# Patient Record
Sex: Female | Born: 1990 | Race: White | Hispanic: No | Marital: Married | Smoking: Never smoker
Health system: Southern US, Community
[De-identification: ages and names within clinical notes are randomized; demographics above are authoritative.]

## PROBLEM LIST (undated history)

## (undated) ENCOUNTER — Inpatient Hospital Stay (HOSPITAL_COMMUNITY): Payer: Self-pay

## (undated) DIAGNOSIS — Z9889 Other specified postprocedural states: Secondary | ICD-10-CM

## (undated) DIAGNOSIS — Z8619 Personal history of other infectious and parasitic diseases: Secondary | ICD-10-CM

## (undated) DIAGNOSIS — K8020 Calculus of gallbladder without cholecystitis without obstruction: Secondary | ICD-10-CM

## (undated) DIAGNOSIS — R112 Nausea with vomiting, unspecified: Secondary | ICD-10-CM

## (undated) DIAGNOSIS — F419 Anxiety disorder, unspecified: Secondary | ICD-10-CM

## (undated) DIAGNOSIS — F32A Depression, unspecified: Secondary | ICD-10-CM

## (undated) DIAGNOSIS — Z789 Other specified health status: Secondary | ICD-10-CM

## (undated) DIAGNOSIS — E785 Hyperlipidemia, unspecified: Secondary | ICD-10-CM

## (undated) HISTORY — DX: Other specified postprocedural states: Z98.890

## (undated) HISTORY — PX: ANTERIOR CRUCIATE LIGAMENT REPAIR: SHX115

## (undated) HISTORY — DX: Anxiety disorder, unspecified: F41.9

## (undated) HISTORY — PX: WISDOM TOOTH EXTRACTION: SHX21

## (undated) HISTORY — DX: Calculus of gallbladder without cholecystitis without obstruction: K80.20

## (undated) HISTORY — DX: Other specified postprocedural states: R11.2

## (undated) HISTORY — DX: Hyperlipidemia, unspecified: E78.5

## (undated) HISTORY — DX: Depression, unspecified: F32.A

## (undated) HISTORY — DX: Personal history of other infectious and parasitic diseases: Z86.19

---
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
## 2009-07-28 ENCOUNTER — Emergency Department (HOSPITAL_COMMUNITY): Admission: EM | Admit: 2009-07-28 | Discharge: 2009-07-28 | Payer: Self-pay | Admitting: Emergency Medicine

## 2010-01-19 ENCOUNTER — Emergency Department (HOSPITAL_COMMUNITY): Admission: EM | Admit: 2010-01-19 | Discharge: 2010-01-19 | Payer: Self-pay | Admitting: Emergency Medicine

## 2011-01-08 LAB — POCT PREGNANCY, URINE: Preg Test, Ur: NEGATIVE

## 2011-06-27 ENCOUNTER — Emergency Department (INDEPENDENT_AMBULATORY_CARE_PROVIDER_SITE_OTHER): Payer: BC Managed Care – PPO

## 2011-06-27 ENCOUNTER — Emergency Department (HOSPITAL_BASED_OUTPATIENT_CLINIC_OR_DEPARTMENT_OTHER)
Admission: EM | Admit: 2011-06-27 | Discharge: 2011-06-27 | Disposition: A | Discharge status: Home / Self Care | Payer: BC Managed Care – PPO | Attending: Emergency Medicine | Admitting: Emergency Medicine

## 2011-06-27 ENCOUNTER — Encounter: Payer: Self-pay | Admitting: Emergency Medicine

## 2011-06-27 DIAGNOSIS — M25569 Pain in unspecified knee: Secondary | ICD-10-CM

## 2011-06-27 DIAGNOSIS — S8391XA Sprain of unspecified site of right knee, initial encounter: Secondary | ICD-10-CM

## 2011-06-27 DIAGNOSIS — IMO0002 Reserved for concepts with insufficient information to code with codable children: Secondary | ICD-10-CM | POA: Insufficient documentation

## 2011-06-27 DIAGNOSIS — X500XXA Overexertion from strenuous movement or load, initial encounter: Secondary | ICD-10-CM | POA: Insufficient documentation

## 2011-06-27 MED ORDER — OXYCODONE-ACETAMINOPHEN 5-325 MG PO TABS: 1.0000 | ORAL_TABLET | ORAL | Status: AC | PRN

## 2011-06-27 NOTE — ED Notes (Addendum)
Pt presents to ED today with injury to Right knee that she states "I was climbing out of the pool and knelt down on just the right one"  Pt has very minimal swelling noted and has limited movement without pain to knee.  Pt sent to xray

## 2011-06-27 NOTE — ED Provider Notes (Addendum)
History     CSN: [id number] Arrival date & time: 06/27/2011  8:27 PM  Chief Complaint  Patient presents with  . Knee Injury   Patient is a 20 y.o. female presenting with knee pain. The history is provided by the patient.  Knee Pain This is a new problem. The current episode started 3 to 5 hours ago. The problem occurs constantly. The problem has not changed since onset.Exacerbated by: Pain is worse with movement and with weight bearing. The symptoms are relieved by nothing. She has tried nothing for the symptoms.  She twisted her knee getting out of a swimming pool, and says it was a valgus injury. She has seen Dr. Thomasena Edis for an injury to her other knee. She rates her pain at 8/10.  History reviewed. No pertinent past medical history.  Past Surgical History  Procedure Date  . Anterior cruciate ligament repair     left knee    No family history on file.  History  Substance Use Topics  . Smoking status: Never Smoker   . Smokeless tobacco: Not on file  . Alcohol Use: No    OB History    Grav Para Term Preterm Abortions TAB SAB Ect Mult Living                  Review of Systems  All other systems reviewed and are negative.    Physical Exam  BP 118/74  Pulse 99  Temp 98.1 F (36.7 C)  Resp 18  SpO2 100%  LMP 06/20/2011  Physical Exam  Nursing note and vitals reviewed. Constitutional: She is oriented to person, place, and time. She appears well-developed and well-nourished. No distress.  HENT:  Head: Normocephalic and atraumatic.  Right Ear: External ear normal.  Left Ear: External ear normal.  Mouth/Throat: Oropharynx is clear and moist.  Eyes: Conjunctivae and EOM are normal. Pupils are equal, round, and reactive to light. No scleral icterus.  Neck: Normal range of motion. Neck supple. No JVD present.  Cardiovascular: Normal rate, regular rhythm and normal heart sounds.   No murmur heard. Pulmonary/Chest: Effort normal and breath sounds normal. She has no  wheezes. She has no rales. She exhibits no tenderness.  Abdominal: Soft. Bowel sounds are normal. She exhibits no mass. There is no tenderness.  Musculoskeletal: She exhibits no edema.       Mild swelling of the right knee, with diffuse tenderness. Small suprapatellar effusion present. No instability. Negative Lachman and McMurray Tests.  Lymphadenopathy:    She has no cervical adenopathy.  Neurological: She is alert and oriented to person, place, and time. No cranial nerve deficit. Coordination normal.  Skin: Skin is warm and dry. No rash noted.  Psychiatric: She has a normal mood and affect. Her behavior is normal. Judgment and thought content normal.    ED Course  Procedures She was placed ina knee immobilizer and given crutches. MDM Knee injury - probable meniscus injury, but possible ligamentous injury. X-rays reviewed, images viewed by me.  Results for orders placed during the hospital encounter of 01/19/10  POCT PREGNANCY, URINE      Component Value Range   Preg Test, Ur       Value: NEGATIVE            THE SENSITIVITY OF THIS     METHODOLOGY IS >24 mIU/mL   Dg Knee 2 Views Right  06/27/2011  *RADIOLOGY REPORT*  Clinical Data: Right knee pain after climbing a ladder.  RIGHT KNEE -  1-2 VIEW  Comparison: None.  Findings: AP and lateral views of the right knee demonstrate normal appearing bones and soft tissues.  No effusion is seen.  IMPRESSION: Normal examination.  Original Report Authenticated By: Darrol Angel, M.D.      Dione Booze, MD 06/27/11 2352  Dione Booze, MD 07/09/11 [phone]

## 2011-06-27 NOTE — ED Notes (Signed)
Pt c/o right knee pain after getting out of pool without using ladder.

## 2012-04-06 LAB — OB RESULTS CONSOLE RUBELLA ANTIBODY, IGM: Rubella: IMMUNE

## 2012-04-06 LAB — OB RESULTS CONSOLE GC/CHLAMYDIA: Gonorrhea: NEGATIVE

## 2012-04-06 LAB — OB RESULTS CONSOLE ABO/RH: RH Type: POSITIVE

## 2012-07-26 ENCOUNTER — Inpatient Hospital Stay (HOSPITAL_COMMUNITY)
Admission: AD | Admit: 2012-07-26 | Discharge: 2012-07-26 | Disposition: A | Discharge status: Hospice | Payer: BC Managed Care – PPO | Source: Ambulatory Visit | Attending: Obstetrics & Gynecology | Admitting: Obstetrics & Gynecology

## 2012-07-26 DIAGNOSIS — O99891 Other specified diseases and conditions complicating pregnancy: Secondary | ICD-10-CM | POA: Insufficient documentation

## 2012-07-26 DIAGNOSIS — M549 Dorsalgia, unspecified: Secondary | ICD-10-CM | POA: Insufficient documentation

## 2012-07-26 LAB — URINALYSIS, ROUTINE W REFLEX MICROSCOPIC
Bilirubin Urine: NEGATIVE
Leukocytes, UA: NEGATIVE
Nitrite: NEGATIVE
Urobilinogen, UA: 0.2 mg/dL (ref 0.0–1.0)

## 2012-07-26 NOTE — ED Provider Notes (Signed)
Patient at 28 weeks noted sudden onset of severe back pain about 2 hours ago.  Uncomplicated pregnancy.  Denies vaginal bleeding.  She was concerned that she was having back labor.  Pain is much less severe at this time.  Abdomen is soft and baby is active. Cervix is firm and closed.  Impression:  No evidence for labor or abruption.  Plan:  Monitor baby to establish that no regular contractions are present and that FHT's are reactive.  Routine follow up in office.

## 2012-09-23 LAB — OB RESULTS CONSOLE GBS: GBS: NEGATIVE

## 2012-09-28 ENCOUNTER — Encounter (HOSPITAL_COMMUNITY): Payer: Self-pay | Admitting: *Deleted

## 2012-09-28 ENCOUNTER — Inpatient Hospital Stay (HOSPITAL_COMMUNITY)
Admission: AD | Admit: 2012-09-28 | Discharge: 2012-09-28 | Disposition: A | Discharge status: Home / Self Care | Payer: No Typology Code available for payment source | Source: Ambulatory Visit | Attending: Obstetrics and Gynecology | Admitting: Obstetrics and Gynecology

## 2012-09-28 DIAGNOSIS — O479 False labor, unspecified: Secondary | ICD-10-CM

## 2012-09-28 DIAGNOSIS — L293 Anogenital pruritus, unspecified: Secondary | ICD-10-CM

## 2012-09-28 DIAGNOSIS — L292 Pruritus vulvae: Secondary | ICD-10-CM

## 2012-09-28 HISTORY — DX: Other specified health status: Z78.9

## 2012-09-28 LAB — WET PREP, GENITAL: Clue Cells Wet Prep HPF POC: NONE SEEN

## 2012-09-28 MED ORDER — NYSTATIN-TRIAMCINOLONE 100000-0.1 UNIT/GM-% EX OINT: TOPICAL_OINTMENT | Freq: Two times a day (BID) | CUTANEOUS | Status: DC

## 2012-09-28 NOTE — MAU Note (Signed)
Pt C/O lower back pain that is shooting down her legs, started today.  Also pain in ribs bilaterally.  Pt denies bleeding or LOF.

## 2012-09-28 NOTE — MAU Provider Note (Signed)
Chief Complaint:  Labor Eval  First Provider Initiated Contact with Patient 09/28/12 1932     HPI: Sandra Saunders is a 21 y.o. G1P0 at [redacted]w[redacted]d who presents to maternity admissions reporting intermittent pain shooting down both legs and pressure in her ribs. Was initially unsure if she was contracting, but realized that her discomforts correlated w/ contractions of toco  Also reports vulvar itching and increased yellow discharge. Denies leakage of fluid or vaginal bleeding. Good fetal movement.   Past Medical History: Past Medical History  Diagnosis Date  . No pertinent past medical history     Past obstetric history: OB History    Grav Para Term Preterm Abortions TAB SAB Ect Mult Living   1              # Outc Date GA Lbr Len/2nd Wgt Sex Del Anes PTL Lv   1 CUR               Past Surgical History: Past Surgical History  Procedure Date  . Anterior cruciate ligament repair     left knee  . Wisdom tooth extraction     Family History: History reviewed. No pertinent family history.  Social History: History  Substance Use Topics  . Smoking status: Never Smoker   . Smokeless tobacco: Not on file  . Alcohol Use: No    Allergies:  Allergies  Allergen Reactions  . Cephalexin Rash    Meds:  Prescriptions prior to admission  Medication Sig Dispense Refill  . acetaminophen (TYLENOL) 325 MG tablet Take 650 mg by mouth every 6 (six) hours as needed. For pain      . calcium carbonate (TUMS - DOSED IN MG ELEMENTAL CALCIUM) 500 MG chewable tablet Chew 1-2 tablets by mouth 3 (three) times daily as needed. For heartburn      . Prenatal Vit-Fe Fumarate-FA (PRENATAL MULTIVITAMIN) TABS Take 1 tablet by mouth at bedtime.        ROS: Pertinent findings in history of present illness.  Physical Exam  Blood pressure 114/69, pulse 94, temperature 96.9 F (36.1 C), temperature source Oral, resp. rate 18, height 5\' 5"  (1.651 m), weight 99.156 kg (218 lb 9.6 oz), last menstrual period  06/20/2011. GENERAL: Well-developed, well-nourished female in no acute distress.  HEENT: normocephalic HEART: normal rate RESP: normal effort ABDOMEN: Soft, non-tender, gravid appropriate for gestational age EXTREMITIES: Nontender, no edema NEURO: alert and oriented SPECULUM EXAM: NEFG, physiologic discharge, no blood, cervix clean Dilation: 1 Effacement (%): 30 Station: -3 Presentation: Vertex Exam by:: Ivonne Andrew CNM  FHT:  Baseline 140 , moderate variability, accelerations present, no decelerations Contractions: Irreg, mild   Labs: Results for orders placed during the hospital encounter of 09/28/12 (from the past 24 hour(s))  WET PREP, GENITAL     Status: Abnormal   Collection Time   09/28/12  7:41 PM      Component Value Range   Yeast Wet Prep HPF POC NONE SEEN  NONE SEEN   Trich, Wet Prep NONE SEEN  NONE SEEN   Clue Cells Wet Prep HPF POC NONE SEEN  NONE SEEN   WBC, Wet Prep HPF POC FEW (*) NONE SEEN   Imaging:  No results found.  MAU Course:   Assessment: 1. Vulvovaginal pruritus   2. Braxton Hick's contraction     Plan: Discharge home Labor precautions and fetal kick counts     Follow-up Information    Follow up with HORVATH,MICHELLE A, MD. (as scheduled)  Contact information:   [address] [place] [address] [phone]       Follow up with [facility] [place] [facility]. (As needed if symptoms worsen)    Contact information:   [address] [id number] mc [place] [address] [phone]          Medication List     As of 09/28/2012  8:12 PM    TAKE these medications         acetaminophen 325 MG tablet   Commonly known as: TYLENOL   Take 650 mg by mouth every 6 (six) hours as needed. For pain      calcium carbonate 500 MG chewable tablet   Commonly known as: TUMS - dosed in mg elemental calcium   Chew 1-2 tablets by mouth 3 (three) times daily as needed. For heartburn       nystatin-triamcinolone ointment   Commonly known as: MYCOLOG   Apply topically 2 (two) times daily.      prenatal multivitamin Tabs   Take 1 tablet by mouth at bedtime.         [place], CNM 09/28/2012 8:12 PM

## 2012-09-28 NOTE — MAU Note (Signed)
Patient states she is having back pain that runs down right leg. Not sure if contractions. Denies any leaking or bleeding and reports good fetal movement.

## 2012-10-20 NOTE — L&D Delivery Note (Signed)
Delivery Note At 11:41 PM a viable and healthy female was delivered via Vaginal, Spontaneous Delivery (Presentation: Left Occiput; Anterior  ).  APGAR: 7, 8; weight 7 lb 9.2 oz (3435 g).   Placenta status: Intact, Spontaneous.  Cord: 3 vessels   Pt progressed quickly from 7 to complete and + 2 station.  She pushed for 5 minutes and then delivered the infants head to crowning following the body.  A large gush of fluid followed delivery of the infant from the amnioinfusion.  The infant cried and was passed to the waiting maternal abdomen.  After the cord was clamped and cut the infant was passed to the isolet for additional stimulation and suction.  The infant responded well.  The placenta delivered spontaneously, intact, with 3 vessel cord.  A small partial 3rd degree laceration was repaired with 0 vicryl then a second degree laceration was repaired with 3-0 vicryl.  EBL 300 cc. Mother and baby are doing well after delivery.  Anesthesia: Epidural  Episiotomy: None Lacerations: 3rd degree, partial Suture Repair: 3.0 vicryl, and 0 vicryl Est. Blood Loss (mL): 300  Mom to postpartum.  Baby to nursery-stable.  Nubia Ziesmer,Zandria H. 10/26/2012, 12:25 AM

## 2012-10-21 ENCOUNTER — Telehealth (HOSPITAL_COMMUNITY): Payer: Self-pay | Admitting: *Deleted

## 2012-10-21 ENCOUNTER — Encounter (HOSPITAL_COMMUNITY): Payer: Self-pay | Admitting: *Deleted

## 2012-10-21 NOTE — Telephone Encounter (Signed)
Preadmission screen  

## 2012-10-24 ENCOUNTER — Encounter (HOSPITAL_COMMUNITY): Payer: Self-pay

## 2012-10-24 ENCOUNTER — Inpatient Hospital Stay (HOSPITAL_COMMUNITY)
Admission: RE | Admit: 2012-10-24 | Discharge: 2012-10-27 | DRG: 775 | Disposition: A | Discharge status: Home / Self Care | Payer: No Typology Code available for payment source | Source: Ambulatory Visit | Attending: Obstetrics and Gynecology | Admitting: Obstetrics and Gynecology

## 2012-10-24 VITALS — BP 112/55 | HR 75 | Temp 98.0°F | Resp 18 | Ht 66.0 in | Wt 218.0 lb

## 2012-10-24 DIAGNOSIS — O480 Post-term pregnancy: Principal | ICD-10-CM | POA: Diagnosis present

## 2012-10-24 DIAGNOSIS — Z3480 Encounter for supervision of other normal pregnancy, unspecified trimester: Secondary | ICD-10-CM

## 2012-10-24 LAB — CBC
Platelets: 171 10*3/uL (ref 150–400)
RBC: 4.05 MIL/uL (ref 3.87–5.11)
RDW: 14.8 % (ref 11.5–15.5)
WBC: 10.6 10*3/uL — ABNORMAL HIGH (ref 4.0–10.5)

## 2012-10-24 LAB — TYPE AND SCREEN: Antibody Screen: NEGATIVE

## 2012-10-24 MED ORDER — OXYTOCIN 40 UNITS IN LACTATED RINGERS INFUSION - SIMPLE MED: 62.5000 mL/h | INTRAVENOUS | Status: DC

## 2012-10-24 MED ORDER — LACTATED RINGERS IV SOLN: 500.0000 mL | INTRAVENOUS | Status: DC | PRN

## 2012-10-24 MED ORDER — ZOLPIDEM TARTRATE 5 MG PO TABS
5.0000 mg | ORAL_TABLET | Freq: Every evening | ORAL | Status: DC | PRN
  Administered 2012-10-25: 5 mg via ORAL
  Filled 2012-10-24: qty 1

## 2012-10-24 MED ORDER — OXYCODONE-ACETAMINOPHEN 5-325 MG PO TABS: 1.0000 | ORAL_TABLET | ORAL | Status: DC | PRN

## 2012-10-24 MED ORDER — ACETAMINOPHEN 325 MG PO TABS: 650.0000 mg | ORAL_TABLET | ORAL | Status: DC | PRN

## 2012-10-24 MED ORDER — CITRIC ACID-SODIUM CITRATE 334-500 MG/5ML PO SOLN: 30.0000 mL | ORAL | Status: DC | PRN

## 2012-10-24 MED ORDER — IBUPROFEN 600 MG PO TABS
600.0000 mg | ORAL_TABLET | Freq: Four times a day (QID) | ORAL | Status: DC | PRN
  Administered 2012-10-26: 600 mg via ORAL
  Filled 2012-10-24 (×2): qty 1

## 2012-10-24 MED ORDER — MISOPROSTOL 25 MCG QUARTER TABLET
25.0000 ug | ORAL_TABLET | ORAL | Status: DC | PRN
  Administered 2012-10-24 – 2012-10-25 (×2): 25 ug via VAGINAL
  Filled 2012-10-24 (×2): qty 0.25

## 2012-10-24 MED ORDER — LIDOCAINE HCL (PF) 1 % IJ SOLN
30.0000 mL | INTRAMUSCULAR | Status: DC | PRN
  Administered 2012-10-25: 30 mL via SUBCUTANEOUS
  Filled 2012-10-24: qty 30

## 2012-10-24 MED ORDER — ONDANSETRON HCL 4 MG/2ML IJ SOLN: 4.0000 mg | Freq: Four times a day (QID) | INTRAMUSCULAR | Status: DC | PRN

## 2012-10-24 MED ORDER — TERBUTALINE SULFATE 1 MG/ML IJ SOLN: 0.2500 mg | Freq: Once | INTRAMUSCULAR | Status: AC | PRN

## 2012-10-24 MED ORDER — OXYTOCIN BOLUS FROM INFUSION: 500.0000 mL | INTRAVENOUS | Status: DC

## 2012-10-24 MED ORDER — LACTATED RINGERS IV SOLN
INTRAVENOUS | Status: DC
  Administered 2012-10-25: 07:00:00 via INTRAVENOUS

## 2012-10-24 NOTE — Progress Notes (Signed)
Called Dr. Henderson Cloud and notified of pt admitted, FHR non reactive on admission but not reactive and reassuring. Notified of SVE and medical history. Orders received for Cytotec through the night.

## 2012-10-25 ENCOUNTER — Encounter (HOSPITAL_COMMUNITY): Payer: Self-pay

## 2012-10-25 ENCOUNTER — Encounter (HOSPITAL_COMMUNITY): Payer: Self-pay | Admitting: Anesthesiology

## 2012-10-25 ENCOUNTER — Inpatient Hospital Stay (HOSPITAL_COMMUNITY): Payer: No Typology Code available for payment source | Admitting: Anesthesiology

## 2012-10-25 MED ORDER — FENTANYL 2.5 MCG/ML BUPIVACAINE 1/10 % EPIDURAL INFUSION (WH - ANES)
14.0000 mL/h | INTRAMUSCULAR | Status: DC
  Administered 2012-10-25 (×2): 14 mL/h via EPIDURAL
  Filled 2012-10-25 (×3): qty 125

## 2012-10-25 MED ORDER — PHENYLEPHRINE 40 MCG/ML (10ML) SYRINGE FOR IV PUSH (FOR BLOOD PRESSURE SUPPORT)
80.0000 ug | PREFILLED_SYRINGE | INTRAVENOUS | Status: DC | PRN
  Filled 2012-10-25: qty 5

## 2012-10-25 MED ORDER — LACTATED RINGERS IV SOLN: 500.0000 mL | Freq: Once | INTRAVENOUS | Status: DC

## 2012-10-25 MED ORDER — BUTORPHANOL TARTRATE 1 MG/ML IJ SOLN
1.0000 mg | INTRAMUSCULAR | Status: DC | PRN
  Administered 2012-10-25: 1 mg via INTRAVENOUS
  Filled 2012-10-25: qty 1

## 2012-10-25 MED ORDER — LACTATED RINGERS IV SOLN
INTRAVENOUS | Status: DC
  Administered 2012-10-25 (×2): via INTRAUTERINE

## 2012-10-25 MED ORDER — LIDOCAINE HCL (PF) 1 % IJ SOLN
INTRAMUSCULAR | Status: DC | PRN
  Administered 2012-10-25 (×2): 9 mL

## 2012-10-25 MED ORDER — TERBUTALINE SULFATE 1 MG/ML IJ SOLN: 0.2500 mg | Freq: Once | INTRAMUSCULAR | Status: AC | PRN

## 2012-10-25 MED ORDER — DIPHENHYDRAMINE HCL 50 MG/ML IJ SOLN
12.5000 mg | INTRAMUSCULAR | Status: DC | PRN
  Administered 2012-10-25: 12.5 mg via INTRAVENOUS
  Filled 2012-10-25: qty 1

## 2012-10-25 MED ORDER — EPHEDRINE 5 MG/ML INJ: 10.0000 mg | INTRAVENOUS | Status: DC | PRN

## 2012-10-25 MED ORDER — PHENYLEPHRINE 40 MCG/ML (10ML) SYRINGE FOR IV PUSH (FOR BLOOD PRESSURE SUPPORT): 80.0000 ug | PREFILLED_SYRINGE | INTRAVENOUS | Status: DC | PRN

## 2012-10-25 MED ORDER — EPHEDRINE 5 MG/ML INJ
10.0000 mg | INTRAVENOUS | Status: DC | PRN
  Filled 2012-10-25: qty 4

## 2012-10-25 MED ORDER — OXYTOCIN 40 UNITS IN LACTATED RINGERS INFUSION - SIMPLE MED
1.0000 m[IU]/min | INTRAVENOUS | Status: DC
  Administered 2012-10-25: 2 m[IU]/min via INTRAVENOUS
  Filled 2012-10-25: qty 1000

## 2012-10-25 MED ORDER — FENTANYL 2.5 MCG/ML BUPIVACAINE 1/10 % EPIDURAL INFUSION (WH - ANES)
INTRAMUSCULAR | Status: DC | PRN
  Administered 2012-10-25: 14 mL/h via EPIDURAL

## 2012-10-25 NOTE — Progress Notes (Signed)
Patient ID: Sandra Saunders, female   DOB: 1991/01/15, 22 y.o.   MRN: [id number]    S: Comfortable O: AFVSS FHT 140s occassional variables but no longer severe toc Q3-6  A/P 1) Severe variables resolved.  Restart pit in 30 min

## 2012-10-25 NOTE — Progress Notes (Signed)
Patient ID: ALEXSYS ESKIN, female   DOB: Mar 06, 1991, 22 y.o.   MRN: [id number]  S: Still Comfortable O: AFVSS Cxv 5/90/-1 per RN exam FHT 140 reactive with accels Toco Q 4  A/P 1) Pit at 2, increased to 4 2) FWB reassuring

## 2012-10-25 NOTE — Progress Notes (Signed)
Pt called out feeling like her water broke. No visible fluid noted, pad dry. Fern Negative. Will continue to monitor.

## 2012-10-25 NOTE — Progress Notes (Signed)
Patient ID: Sandra Saunders, female   DOB: [birth year], 22 y.o.   MRN: [id number]  S: Comfortable with epidural O: Filed Vitals:   10/25/12 0831  BP: 106/63  Pulse: 106  Temp:   Resp: 18   cvx tight 3/80/-2 toco Q 2-4 FHT 130-140 reactive  A/P 1) continue pitocin 2) FWB reassuring

## 2012-10-25 NOTE — H&P (Signed)
22 y.o. [redacted]w[redacted]d  G1P0 comes in for postdates induction.  Otherwise has good fetal movement and no bleeding.  Past Medical History  Diagnosis Date  . No pertinent past medical history   . H/O varicella   . PONV (postoperative nausea and vomiting)     Past Surgical History  Procedure Date  . Anterior cruciate ligament repair     left knee  . Wisdom tooth extraction     OB History    Grav Para Term Preterm Abortions TAB SAB Ect Mult Living   1              # Outc Date GA Lbr Len/2nd Wgt Sex Del Anes PTL Lv   1 CUR               History   Social History  . Marital Status: Married    Spouse Name: N/A    Number of Children: N/A  . Years of Education: N/A   Occupational History  . Not on file.   Social History Main Topics  . Smoking status: Never Smoker   . Smokeless tobacco: Not on file  . Alcohol Use: No  . Drug Use: No  . Sexually Active: Yes   Other Topics Concern  . Not on file   Social History Narrative  . No narrative on file   Cephalexin    Prenatal Transfer Tool  Maternal Diabetes: No Genetic Screening: Normal Maternal Ultrasounds/Referrals: Normal Fetal Ultrasounds or other Referrals:  None Maternal Substance Abuse:  No Significant Maternal Medications:  None Significant Maternal Lab Results: None  Other PNC: Uncomplicated.   Filed Vitals:   10/25/12 0400  BP:   Pulse:   Temp:   Resp: 18     Lungs/Cor:  NAD Abdomen:  soft, gravid Ex:  no cords, erythema SVE:  1/50/-2; SROM at 0400. FHTs:  120, good STV, NST R Toco:  q 3-5   A/P   Postdates induction.  S/p two doses cytotec and now on pitocin.  GBS neg.  Sandra Saunders A

## 2012-10-25 NOTE — Anesthesia Procedure Notes (Signed)
Epidural Patient location during procedure: OB Start time: 10/25/2012 6:01 AM End time: 10/25/2012 6:05 AM  Staffing Anesthesiologist: Sandrea Hughs Performed by: anesthesiologist   Preanesthetic Checklist Completed: patient identified, site marked, surgical consent, pre-op evaluation, timeout performed, IV checked, risks and benefits discussed and monitors and equipment checked  Epidural Patient position: sitting Prep: site prepped and draped and DuraPrep Patient monitoring: continuous pulse ox and blood pressure Approach: midline Injection technique: LOR air  Needle:  Needle type: Tuohy  Needle gauge: 17 G Needle length: 9 cm and 9 Needle insertion depth: 6 cm Catheter type: closed end flexible Catheter size: 19 Gauge Catheter at skin depth: 11 cm Test dose: negative and Other  Assessment Sensory level: T8 Events: blood not aspirated, injection not painful, no injection resistance, negative IV test and no paresthesia  Additional Notes Reason for block:procedure for pain

## 2012-10-25 NOTE — Progress Notes (Signed)
Patient ID: Sandra Saunders, female   DOB: 24-Apr-1991, 22 y.o.   MRN: [id number]  S: Comfortable with epidural.  Having variable decelerations O: Filed Vitals:   10/25/12 1001 10/25/12 1012 10/25/12 1031 10/25/12 1103  BP: 104/64  102/73 108/65  Pulse: 95  92 89  Temp:      TempSrc:      Resp:  18    Height:      Weight:      SpO2:       Aox3, NAF Gravid, soft Cvx 4/90/-2 toco Q2-4 FHT140s with accels and variable decelerations. Positive scalp stim  A/P IUPC placed FWB reassuring overall

## 2012-10-25 NOTE — Anesthesia Preprocedure Evaluation (Signed)

## 2012-10-25 NOTE — Progress Notes (Signed)
Patient ID: Sandra Saunders, female   DOB: Jan 24, 1991, 22 y.o.   MRN: [id number]  S: Comfortable O:AFVSS cvx 4-5/80/-2 toco Q2-4 FHT 140 with severe variables with slow return to baseline.  Moderate variability, and accels  A/P 1) pitocin off 2) Continue Amnioinfusion.  D/W pt severe variables with slow return to baseline and remote from delivery.  Will continue amnioinfusion and see how fetus responds.  If severe variables continue given remote from delivery may need to proceed with cesarean section.  This was discussed with patient and her husband.

## 2012-10-26 ENCOUNTER — Encounter (HOSPITAL_COMMUNITY): Payer: Self-pay

## 2012-10-26 LAB — CBC
Hemoglobin: 10.3 g/dL — ABNORMAL LOW (ref 12.0–15.0)
MCV: 87.4 fL (ref 78.0–100.0)
Platelets: 152 10*3/uL (ref 150–400)
RBC: 3.64 MIL/uL — ABNORMAL LOW (ref 3.87–5.11)
WBC: 19.4 10*3/uL — ABNORMAL HIGH (ref 4.0–10.5)

## 2012-10-26 MED ORDER — BENZOCAINE-MENTHOL 20-0.5 % EX AERO
1.0000 "application " | INHALATION_SPRAY | CUTANEOUS | Status: DC | PRN
  Administered 2012-10-26: 1 via TOPICAL
  Filled 2012-10-26: qty 56

## 2012-10-26 MED ORDER — SIMETHICONE 80 MG PO CHEW: 80.0000 mg | CHEWABLE_TABLET | ORAL | Status: DC | PRN

## 2012-10-26 MED ORDER — TETANUS-DIPHTH-ACELL PERTUSSIS 5-2.5-18.5 LF-MCG/0.5 IM SUSP: 0.5000 mL | Freq: Once | INTRAMUSCULAR | Status: DC

## 2012-10-26 MED ORDER — DIPHENHYDRAMINE HCL 25 MG PO CAPS: 25.0000 mg | ORAL_CAPSULE | Freq: Four times a day (QID) | ORAL | Status: DC | PRN

## 2012-10-26 MED ORDER — LANOLIN HYDROUS EX OINT: TOPICAL_OINTMENT | CUTANEOUS | Status: DC | PRN

## 2012-10-26 MED ORDER — ONDANSETRON HCL 4 MG PO TABS: 4.0000 mg | ORAL_TABLET | ORAL | Status: DC | PRN

## 2012-10-26 MED ORDER — OXYCODONE-ACETAMINOPHEN 5-325 MG PO TABS: 1.0000 | ORAL_TABLET | ORAL | Status: DC | PRN

## 2012-10-26 MED ORDER — METHYLERGONOVINE MALEATE 0.2 MG/ML IJ SOLN: 0.2000 mg | INTRAMUSCULAR | Status: DC | PRN

## 2012-10-26 MED ORDER — ONDANSETRON HCL 4 MG/2ML IJ SOLN: 4.0000 mg | INTRAMUSCULAR | Status: DC | PRN

## 2012-10-26 MED ORDER — DIBUCAINE 1 % RE OINT: 1.0000 "application " | TOPICAL_OINTMENT | RECTAL | Status: DC | PRN

## 2012-10-26 MED ORDER — IBUPROFEN 600 MG PO TABS
600.0000 mg | ORAL_TABLET | Freq: Four times a day (QID) | ORAL | Status: DC
  Administered 2012-10-26 – 2012-10-27 (×6): 600 mg via ORAL
  Filled 2012-10-26 (×6): qty 1

## 2012-10-26 MED ORDER — PRENATAL MULTIVITAMIN CH
1.0000 | ORAL_TABLET | Freq: Every day | ORAL | Status: DC
  Administered 2012-10-26 – 2012-10-27 (×2): 1 via ORAL
  Filled 2012-10-26 (×2): qty 1

## 2012-10-26 MED ORDER — ZOLPIDEM TARTRATE 5 MG PO TABS: 5.0000 mg | ORAL_TABLET | Freq: Every evening | ORAL | Status: DC | PRN

## 2012-10-26 MED ORDER — WITCH HAZEL-GLYCERIN EX PADS: 1.0000 "application " | MEDICATED_PAD | CUTANEOUS | Status: DC | PRN

## 2012-10-26 MED ORDER — METHYLERGONOVINE MALEATE 0.2 MG PO TABS: 0.2000 mg | ORAL_TABLET | ORAL | Status: DC | PRN

## 2012-10-26 MED ORDER — SENNOSIDES-DOCUSATE SODIUM 8.6-50 MG PO TABS: 2.0000 | ORAL_TABLET | Freq: Every day | ORAL | Status: DC

## 2012-10-26 NOTE — Anesthesia Postprocedure Evaluation (Signed)
  Anesthesia Post-op Note  Patient: Sandra Saunders  Procedure(s) Performed: * No procedures listed *  Patient Location: Mother/Baby  Anesthesia Type:Epidural  Level of Consciousness: awake  Airway and Oxygen Therapy: Patient Spontanous Breathing  Post-op Pain: mild  Post-op Assessment: Patient's Cardiovascular Status Stable and Respiratory Function Stable  Post-op Vital Signs: stable  Complications: No apparent anesthesia complications

## 2012-10-26 NOTE — Progress Notes (Signed)
Post Partum Day 1 Subjective: no complaints  Objective: Blood pressure 112/72, pulse 83, temperature 97.8 F (36.6 C), temperature source Oral, resp. rate 18, height 5\' 6"  (1.676 m), weight 218 lb (98.884 kg), last menstrual period 06/20/2011, SpO2 97.00%,  breastfeeding.  Physical Exam:  General: alert Lochia: appropriate Uterine Fundus: firm   Basename 10/26/12 0530 10/24/12 2005  HGB 10.3* 11.7*  HCT 31.8* 34.3*    Assessment/Plan: Plan for discharge tomorrow   LOS: 2 days   Zaylee Cornia D 10/26/2012, 10:23 AM

## 2012-10-27 NOTE — Progress Notes (Signed)
PPD#2 Pt without complaints. VSSAF IMP/ doing well Plan/ will discharge to home

## 2012-10-27 NOTE — Discharge Summary (Signed)
Obstetric Discharge Summary Reason for Admission: induction of labor Prenatal Procedures: ultrasound Intrapartum Procedures: spontaneous vaginal delivery Postpartum Procedures: none Complications-Operative and Postpartum: 3 degree perineal laceration Hemoglobin  Date Value Range Status  10/26/2012 10.3* 12.0 - 15.0 g/dL Final     HCT  Date Value Range Status  10/26/2012 31.8* 36.0 - 46.0 % Final    Physical Exam:  General: alert Lochia: appropriate Uterine Fundus: firm  Discharge Diagnoses: Post-date pregnancy  Discharge Information: Date: 10/27/2012 Activity: pelvic rest Diet: routine Medications: PNV and Ibuprofen Condition: stable Instructions: refer to practice specific booklet Discharge to: home Follow-up Information    Follow up with Almon Hercules., MD. Schedule an appointment as soon as possible for a visit in 4 weeks.   Contact information:   [address] [place] [address] [phone]          Newborn Data: Live born female  Birth Weight: 7 lb 9.2 oz (3435 g) APGAR: 7, 8  Home with mother.  ANDERSON,MARK E 10/27/2012, 2:15 PM

## 2013-10-23 ENCOUNTER — Emergency Department (HOSPITAL_BASED_OUTPATIENT_CLINIC_OR_DEPARTMENT_OTHER)
Admission: EM | Admit: 2013-10-23 | Discharge: 2013-10-23 | Disposition: A | Discharge status: Home / Self Care | Payer: 59 | Attending: Emergency Medicine | Admitting: Emergency Medicine

## 2013-10-23 ENCOUNTER — Encounter (HOSPITAL_BASED_OUTPATIENT_CLINIC_OR_DEPARTMENT_OTHER): Payer: Self-pay | Admitting: Emergency Medicine

## 2013-10-23 ENCOUNTER — Emergency Department (HOSPITAL_BASED_OUTPATIENT_CLINIC_OR_DEPARTMENT_OTHER): Payer: 59

## 2013-10-23 DIAGNOSIS — R000 Tachycardia, unspecified: Secondary | ICD-10-CM | POA: Insufficient documentation

## 2013-10-23 DIAGNOSIS — Z8619 Personal history of other infectious and parasitic diseases: Secondary | ICD-10-CM | POA: Insufficient documentation

## 2013-10-23 DIAGNOSIS — IMO0001 Reserved for inherently not codable concepts without codable children: Secondary | ICD-10-CM | POA: Insufficient documentation

## 2013-10-23 DIAGNOSIS — J111 Influenza due to unidentified influenza virus with other respiratory manifestations: Secondary | ICD-10-CM | POA: Insufficient documentation

## 2013-10-23 DIAGNOSIS — Z79899 Other long term (current) drug therapy: Secondary | ICD-10-CM | POA: Insufficient documentation

## 2013-10-23 MED ORDER — SODIUM CHLORIDE 0.9 % IV BOLUS (SEPSIS)
1000.0000 mL | Freq: Once | INTRAVENOUS | Status: AC
  Administered 2013-10-23: 1000 mL via INTRAVENOUS

## 2013-10-23 MED ORDER — IBUPROFEN 800 MG PO TABS
800.0000 mg | ORAL_TABLET | Freq: Once | ORAL | Status: AC
  Administered 2013-10-23: 800 mg via ORAL
  Filled 2013-10-23: qty 1

## 2013-10-23 MED ORDER — OSELTAMIVIR PHOSPHATE 75 MG PO CAPS: 75.0000 mg | ORAL_CAPSULE | Freq: Two times a day (BID) | ORAL | Status: DC

## 2013-10-23 NOTE — Discharge Instructions (Signed)

## 2013-10-23 NOTE — ED Notes (Signed)
Pt also reports nasal congestion and fever.

## 2013-10-23 NOTE — ED Notes (Signed)
Runny nose, cough, fever for several weeks.  Seen by pcp and was given antibiotics but not improving.

## 2013-10-23 NOTE — ED Provider Notes (Signed)
CSN: [id number]     Arrival date & time 10/23/13  [id number] History   First MD Initiated Contact with Patient 10/23/13 1035     Chief Complaint  Patient presents with  . Nasal Congestion  . Cough  . Fever   (Consider location/radiation/quality/duration/timing/severity/associated sxs/prior Treatment) HPI Comments: Had recent URI, treated with antibiotics and she improved. At baseline 2 days ago, sudden onset aches, cough, fever, myalgias.  Patient is a 23 y.o. female presenting with cough and fever. The history is provided by the patient.  Cough Cough characteristics:  Productive Sputum characteristics:  Clear Severity:  Mild Onset quality:  Sudden Duration:  2 days Timing:  Constant Progression:  Worsening Chronicity:  Recurrent Associated symptoms: chills and fever   Associated symptoms: no shortness of breath   Fever Associated symptoms: chills and cough   Associated symptoms: no vomiting     Past Medical History  Diagnosis Date  . No pertinent past medical history   . H/O varicella   . PONV (postoperative nausea and vomiting)    Past Surgical History  Procedure Laterality Date  . Anterior cruciate ligament repair      left knee  . Wisdom tooth extraction     Family History  Problem Relation Age of Onset  . Cancer Mother     melanoma  . Tuberculosis Paternal Grandmother   . Cancer Paternal Grandmother     colon  . Diabetes Paternal Grandfather   . Birth defects Other     omphalocele   History  Substance Use Topics  . Smoking status: Never Smoker   . Smokeless tobacco: Not on file  . Alcohol Use: No   OB History   Grav Para Term Preterm Abortions TAB SAB Ect Mult Living   1 1 1  0 0 0 0 0 0 1     Review of Systems  Constitutional: Positive for fever and chills.  Respiratory: Positive for cough. Negative for shortness of breath.   Gastrointestinal: Negative for vomiting and abdominal pain.  All other systems reviewed and are negative.    Allergies   Cephalexin  Home Medications   Current Outpatient Rx  Name  Route  Sig  Dispense  Refill  . chlorpheniramine-HYDROcodone (TUSSIONEX) 10-8 MG/5ML LQCR   Oral   Take 5 mLs by mouth.         Marland Kitchen. guaiFENesin (MUCINEX) 600 MG 12 hr tablet   Oral   Take by mouth 2 (two) times daily.         Marland Kitchen. acetaminophen (TYLENOL) 325 MG tablet   Oral   Take 650 mg by mouth every 6 (six) hours as needed. For pain in back         . oseltamivir (TAMIFLU) 75 MG capsule   Oral   Take 1 capsule (75 mg total) by mouth every 12 (twelve) hours.   10 capsule   0    BP 122/68  Pulse 99  Temp(Src) 98.6 F (37 C) (Oral)  Resp 16  Ht 5\' 6"  (1.676 m)  Wt 205 lb (92.987 kg)  BMI 33.10 kg/m2  SpO2 100%  LMP 10/23/2013 Physical Exam  Nursing note and vitals reviewed. Constitutional: She is oriented to person, place, and time. She appears well-developed and well-nourished. No distress.  HENT:  Head: Normocephalic and atraumatic.  Eyes: EOM are normal. Pupils are equal, round, and reactive to light.  Neck: Normal range of motion. Neck supple.  Cardiovascular: Regular rhythm.  Tachycardia present.  Exam reveals no friction  rub.   No murmur heard. Pulmonary/Chest: Effort normal and breath sounds normal. No respiratory distress. She has no wheezes. She has no rales.  Abdominal: Soft. She exhibits no distension. There is no tenderness. There is no rebound.  Musculoskeletal: Normal range of motion. She exhibits no edema.  Neurological: She is alert and oriented to person, place, and time.  Skin: She is not diaphoretic.    ED Course  Procedures (including critical care time) Labs Review Labs Reviewed - No data to display Imaging Review Dg Chest 2 View  10/23/2013   CLINICAL DATA:  Coughing and congestion.  EXAM: CHEST  2 VIEW  COMPARISON:  10/07/2010  FINDINGS: The heart size and mediastinal contours are within normal limits. Both lungs are clear. The visualized skeletal structures are unremarkable.   IMPRESSION: No active cardiopulmonary disease.   Electronically Signed   By: Richarda Overlie M.D.   On: 10/23/2013 11:28    EKG Interpretation   None       MDM   1. Influenza    SUBJECTIVE:  SHARION GRIEVES is a 23 y.o. female who present complaining of flu-like symptoms: fevers, chills, myalgias, congestion, sore throat and cough for 2 days. Denies dyspnea or wheezing.  OBJECTIVE: Appears moderately ill but not toxic; temperature as noted in vitals. Ears normal. Throat and pharynx normal.  Neck supple. No adenopathy in the neck. Sinuses non tender. The chest is clear.  ASSESSMENT: Influenza  PLAN: Tamiflu given. Tachycardia resolved, stable for discharge.  Symptomatic therapy suggested: rest, increase fluids and call prn if symptoms persist or worsen. Call or return to clinic prn if these symptoms worsen or fail to improve as anticipated.     Dagmar Hait, MD 10/23/13 [phone]

## 2014-08-21 ENCOUNTER — Encounter (HOSPITAL_BASED_OUTPATIENT_CLINIC_OR_DEPARTMENT_OTHER): Payer: Self-pay | Admitting: Emergency Medicine

## 2014-10-20 NOTE — L&D Delivery Note (Signed)
Patient was C/C/+2 and pushed for 5 minutes with epidural.   NSVD  female infant, Apgars 8,8, weight P.   The patient had a second degree perineal laceration repaired with 2-0 vicryl R. Fundus was firm. EBL was expected amount-100cc. Placenta was delivered intact. Vagina was clear.  Baby was vigorous and doing skin to skin with mother initially but baby's SAO2 was only 80s.  Baby perked up with blow by o2.  Osa Fogarty A

## 2015-01-08 LAB — OB RESULTS CONSOLE RPR: RPR: NONREACTIVE

## 2015-01-08 LAB — OB RESULTS CONSOLE HIV ANTIBODY (ROUTINE TESTING): HIV: NONREACTIVE

## 2015-01-08 LAB — OB RESULTS CONSOLE RUBELLA ANTIBODY, IGM: RUBELLA: IMMUNE

## 2015-01-08 LAB — OB RESULTS CONSOLE ABO/RH: RH TYPE: POSITIVE

## 2015-01-08 LAB — OB RESULTS CONSOLE GC/CHLAMYDIA
Chlamydia: NEGATIVE
Gonorrhea: NEGATIVE

## 2015-01-08 LAB — OB RESULTS CONSOLE ANTIBODY SCREEN: Antibody Screen: NEGATIVE

## 2015-01-08 LAB — OB RESULTS CONSOLE HEPATITIS B SURFACE ANTIGEN: Hepatitis B Surface Ag: NEGATIVE

## 2015-01-24 IMAGING — CR DG CHEST 2V
2 series · 2 of 2 positions shown · non-contrast
Comparison: 10/07/2010

CLINICAL DATA: Coughing and congestion.

EXAM:
CHEST  2 VIEW

[w chest pa]
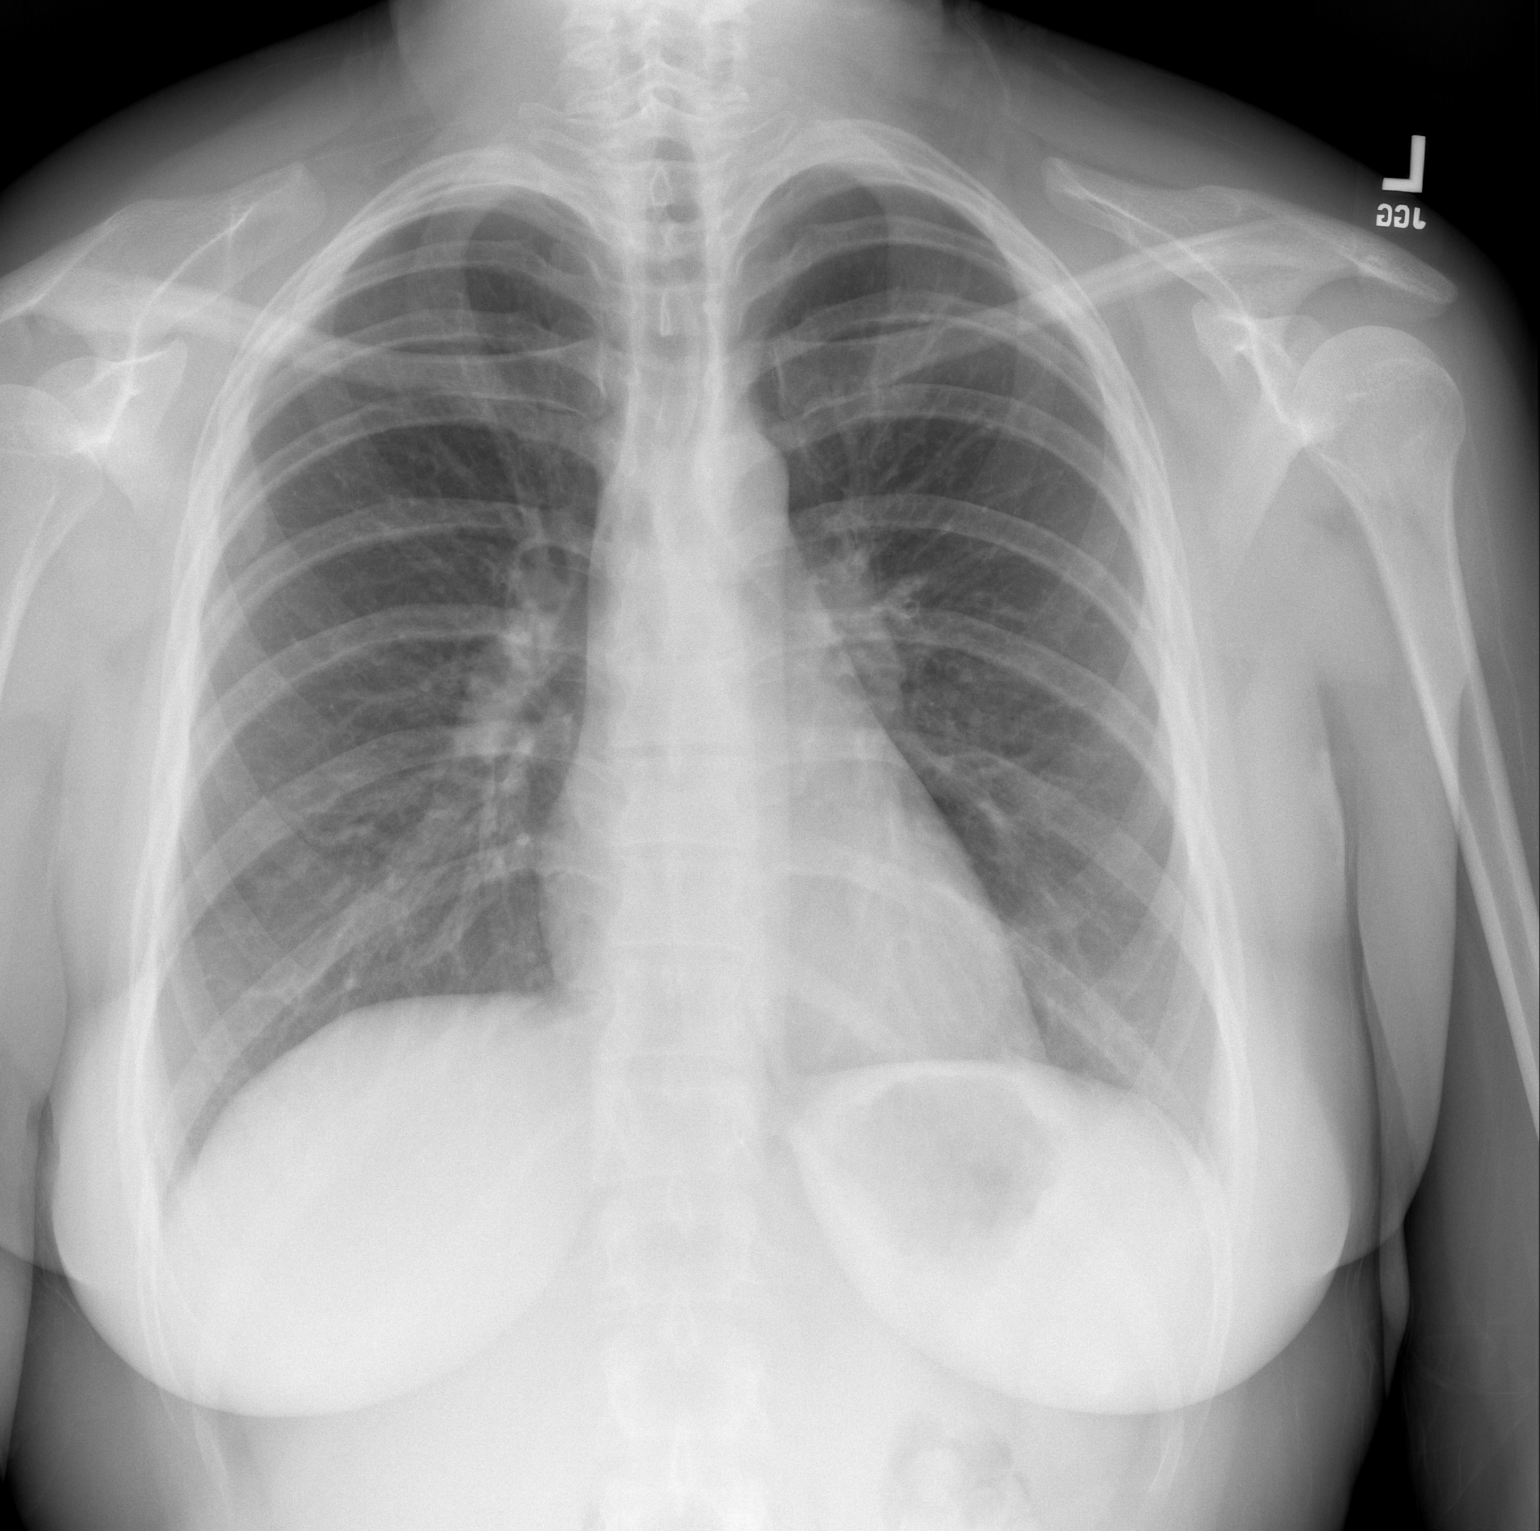

[w chest lat]
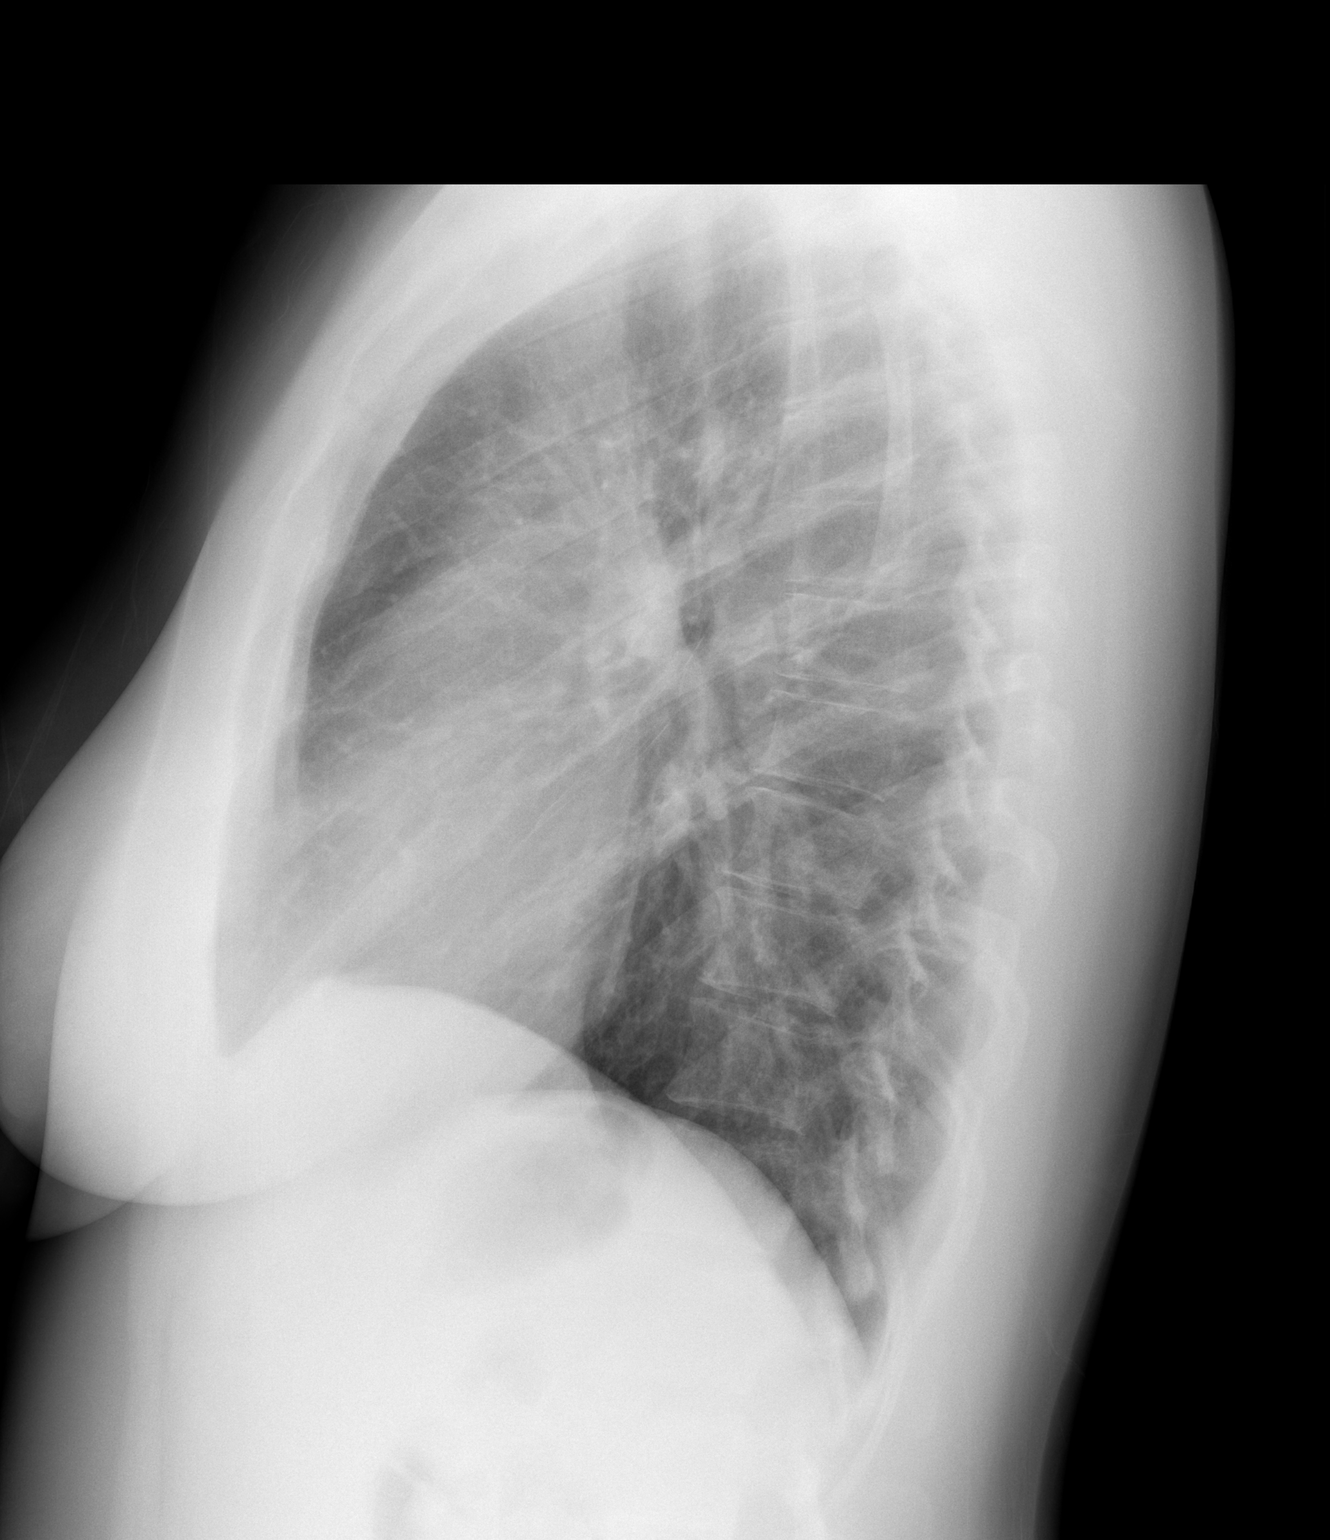

[2 of 2 positions shown; findings below may reference images not displayed]

FINDINGS: The heart size and mediastinal contours are within normal limits.
Both lungs are clear. The visualized skeletal structures are
unremarkable.
IMPRESSION: No active cardiopulmonary disease.

## 2015-07-26 ENCOUNTER — Inpatient Hospital Stay (HOSPITAL_COMMUNITY)
Admission: AD | Admit: 2015-07-26 | Discharge: 2015-07-26 | Disposition: A | Discharge status: Home / Self Care | Payer: Federal, State, Local not specified - PPO | Source: Ambulatory Visit | Attending: Obstetrics | Admitting: Obstetrics

## 2015-07-26 ENCOUNTER — Encounter (HOSPITAL_COMMUNITY): Payer: Self-pay | Admitting: *Deleted

## 2015-07-26 DIAGNOSIS — Z3A35 35 weeks gestation of pregnancy: Secondary | ICD-10-CM | POA: Insufficient documentation

## 2015-07-26 DIAGNOSIS — T1490 Injury, unspecified: Secondary | ICD-10-CM

## 2015-07-26 DIAGNOSIS — O26893 Other specified pregnancy related conditions, third trimester: Secondary | ICD-10-CM | POA: Diagnosis present

## 2015-07-26 DIAGNOSIS — W010XXA Fall on same level from slipping, tripping and stumbling without subsequent striking against object, initial encounter: Secondary | ICD-10-CM | POA: Insufficient documentation

## 2015-07-26 DIAGNOSIS — O9A213 Injury, poisoning and certain other consequences of external causes complicating pregnancy, third trimester: Secondary | ICD-10-CM

## 2015-07-26 NOTE — MAU Note (Signed)
Patient was trying to lift toddler into car when she twisted her ankle and fell down on to her side.  No c/o pain at this time. Denies vaginal bleeding or LOF.  Patient states event just happened and they came straight to MAU.

## 2015-07-26 NOTE — MAU Provider Note (Signed)
History     CSN: [id number]  Arrival date and time: 07/26/15 7829   First Provider Initiated Contact with Patient 07/26/15 0805      Chief Complaint  Patient presents with  . Fall   HPI Sandra Saunders 23 y.o. [id number]  presents to MAU after fall on right side.  She was putting her young daughter in the car when she twisted her ankle and fell onto the diaper bag onto her right side.  Initially, she states she did not feel fetal movement, although she notes it is typical to not feel fetal movement until after she has breakfast.  She has not yet eaten today.  Since just before arrival here, she has felt baby moving.   She continues to note good fetal movement, and denies pain, vaginal bleeding, contractions, nausea, vomiting, dysuria.  She is hungry.  OB History    Gravida Para Term Preterm AB TAB SAB Ectopic Multiple Living   0 0 0 0 0 0 1      Past Medical History  Diagnosis Date  . No pertinent past medical history   . H/O varicella   . PONV (postoperative nausea and vomiting)     Past Surgical History  Procedure Laterality Date  . Anterior cruciate ligament repair      left knee  . Wisdom tooth extraction      Family History  Problem Relation Age of Onset  . Cancer Mother     melanoma  . Tuberculosis Paternal Grandmother   . Cancer Paternal Grandmother     colon  . Diabetes Paternal Grandfather   . Birth defects Other     omphalocele    Social History  Substance Use Topics  . Smoking status: Never Smoker   . Smokeless tobacco: None  . Alcohol Use: No    Allergies:  Allergies  Allergen Reactions  . Cephalexin Rash    Prescriptions prior to admission  Medication Sig Dispense Refill Last Dose  . acetaminophen (TYLENOL) 325 MG tablet Take 650 mg by mouth every 6 (six) hours as needed for headache. For pain in back   Past Month at Unknown time  . calcium carbonate (TUMS - DOSED IN MG ELEMENTAL CALCIUM) 500 MG chewable tablet Chew 2 tablets by mouth  4 (four) times daily as needed for indigestion or heartburn.   07/25/2015 at Unknown time  . Prenatal Vit-Fe Fumarate-FA (PRENATAL MULTIVITAMIN) TABS tablet Take 1 tablet by mouth daily at 12 noon.   07/25/2015 at Unknown time    ROS Pertinent ROS in HPI.  All other systems are negative.   Physical Exam   Blood pressure 107/72, pulse 105, temperature 97.4 F (36.3 C), temperature source Oral, resp. rate 18, height  (1.651 m), weight 226 lb 6.4 oz (102.694 kg).  Physical Exam  Constitutional: She is oriented to person, place, and time. She appears well-developed and well-nourished. No distress.  HENT:  Head: Normocephalic and atraumatic.  Eyes: EOM are normal.  Neck: Normal range of motion.  Cardiovascular: Normal rate and regular rhythm.   Respiratory: Effort normal and breath sounds normal. No respiratory distress.  GI: Soft. She exhibits no distension. There is no tenderness. There is no rebound and no guarding.  Neurological: She is alert and oriented to person, place, and time.  Skin: Skin is warm and dry.  Psychiatric: She has a normal mood and affect.   Fetal Tracing: Baseline:130 Variability:mod Accelerations: 15x15 Decelerations:none - although 1 variable noted Toco:none  Increased variability noted after pt ate (full meal) but baby was able to settle again and return to normal baseline.    MAU Course  Procedures  MDM Discussed with Dr. Chestine Spore.  Will continue to monitor through 4 hours following fall.  Okay for pt to eat.  If no issues - may discharge to home.   Marked variability noted.  Also pt noting right side pain that has occurred twice.  It came on for no known reason, 6-7/10 and then gone within 3 minutes.  Upon moving pt to side, it came on again and pt did not turn over.  Pain again gone within a few minutes.  Dr. Chestine Spore again consulted.  She has reviewed tracing.  Per MD, will move pt, up to bathroom and if no significant pain - okay to discharge pt.    Assessment and Plan  A:  1. Traumatic injury during pregnancy in third trimester      P: Discharge to home F/u in office as scheduled Encourage good self care Preterm Labor precautions Patient may return to MAU as needed or if her condition were to change or worsen  Bertram Denver 07/26/2015, 8:08 AM

## 2015-07-26 NOTE — Discharge Instructions (Signed)
What Do I Need to Know About Injuries During Pregnancy? °Trauma is the most common cause of injury and death in pregnant women. This can also result in significant harm or death of the baby. °Your baby is protected in the womb (uterus) by a sac filled with fluid (amniotic sac). Your baby can be harmed if there is direct, high-impact trauma to your abdomen and pelvis. This type of trauma can result in tearing of your uterus, the placenta pulling away from the wall of the uterus (placenta abruption), or the amniotic sac breaking open (rupture of membranes). These injuries can decrease or stop the blood supply to your baby or cause you to go into labor earlier than expected. Minor falls and low-impact automobile accidents do not usually harm your baby, even if they do minimally harm you. °WHAT KIND OF INJURIES CAN AFFECT MY PREGNANCY? °The most common causes of injury or death to a baby include: °· Falls. Falls are more common in the second and third trimester of the pregnancy. Factors that increase your risk of falling include: °¨ Increase in your weight. °¨ The change in your center of gravity. °¨ Tripping over an object that cannot be seen. °¨ Increased looseness (laxity) of your ligaments resulting in less coordinated movements (you may feel clumsy). °¨ Falling during high-risk activities like horseback riding or skiing. °· Automobile accidents. It is important to wear your seat belt properly, with the lap belt below your abdomen, and always practice safe driving. °· Domestic violence or assault. °· Burns (fire or electrical). °The most common causes of injury or death to the pregnant woman include: °· Injuries that cause severe bleeding, shock, and loss of blood flow to major organs. °· Head and neck injuries that result in severe brain or spinal damage. °· Chest trauma that can cause direct injury to the heart and lungs or any injury that affects the area enclosed by the ribs. Trauma to this area can result in  cardiorespiratory arrest. °WHAT CAN I DO TO PROTECT MYSELF AND MY BABY FROM INJURY WHILE I AM PREGNANT? °· Remove slippery rugs and loose objects on the floor that increase your risk of tripping. °· Avoid walking on wet or slippery floors. °· Wear comfortable shoes that have a good grip on the sole. Do not wear high-heeled shoes. °· Always wear your seat belt properly, with the lap belt below your abdomen, and always practice safe driving. Do not ride on a motorcycle while pregnant. °· Do not participate in high-impact activities or sports. °· Avoid fires, starting fires, lifting heavy pots of boiling or hot liquids, and fixing electrical problems. °· Only take over-the-counter or prescription medicines for pain, fever, or discomfort as directed by your health care provider. °· Know your blood type and the father's blood type in case you develop vaginal bleeding or experience an injury for which a blood transfusion may be necessary. °· Call your local emergency services (911 in the [place] if you are a victim of domestic violence or assault. Spousal abuse can be a significant cause of trauma during pregnancy. For help and support, contact the National Domestic Violence Hotline. °WHEN SHOULD I SEEK IMMEDIATE MEDICAL CARE?  °· You fall on your abdomen or experience any high-force accident or injury. °· You have been assaulted (domestic or otherwise). °· You have been in a car accident. °· You develop vaginal bleeding. °· You develop fluid leaking from the vagina. °· You develop uterine contractions (pelvic cramping, pain, or significant low back   pain). °· You become weak or faint, or have uncontrolled vomiting after trauma. °· You had a serious burn. This includes burns to the face, neck, hands, or genitals, or burns greater than the size of your palm anywhere else. °· You develop neck stiffness or pain after a fall or from other trauma. °· You develop a headache or vision problems after a fall or from other  trauma. °· You do not feel the baby moving or the baby is not moving as much as before a fall or other trauma. °  °This information is not intended to replace advice given to you by your health care provider. Make sure you discuss any questions you have with your health care provider. °  °Document Released: 11/13/2004 Document Revised: 10/27/2014 Document Reviewed: 07/13/2013 °[employer] Interactive Patient Education ©2016 [employer] ° °

## 2015-08-09 ENCOUNTER — Inpatient Hospital Stay (HOSPITAL_COMMUNITY)
Admission: AD | Admit: 2015-08-09 | Discharge: 2015-08-09 | Disposition: A | Discharge status: Home / Self Care | Payer: Federal, State, Local not specified - PPO | Source: Ambulatory Visit | Attending: Obstetrics | Admitting: Obstetrics

## 2015-08-09 ENCOUNTER — Encounter (HOSPITAL_COMMUNITY): Payer: Self-pay | Admitting: *Deleted

## 2015-08-09 DIAGNOSIS — Z3493 Encounter for supervision of normal pregnancy, unspecified, third trimester: Secondary | ICD-10-CM | POA: Diagnosis present

## 2015-08-09 NOTE — Progress Notes (Signed)
Dr Chestine Sporelark notified of pt's complaints, FHR pattern, no contractions, VE, orders received to discharge hme

## 2015-08-09 NOTE — Discharge Instructions (Signed)
Third Trimester of Pregnancy °The third trimester is from week 29 through week 42, months 7 through 9. The third trimester is a time when the fetus is growing rapidly. At the end of the ninth month, the fetus is about 20 inches in length and weighs 6-10 pounds.  °BODY CHANGES °Your body goes through many changes during pregnancy. The changes vary from woman to woman.  °· Your weight will continue to increase. You can expect to gain 25-35 pounds (11-16 kg) by the end of the pregnancy. °· You may begin to get stretch marks on your hips, abdomen, and breasts. °· You may urinate more often because the fetus is moving lower into your pelvis and pressing on your bladder. °· You may develop or continue to have heartburn as a result of your pregnancy. °· You may develop constipation because certain hormones are causing the muscles that push waste through your intestines to slow down. °· You may develop hemorrhoids or swollen, bulging veins (varicose veins). °· You may have pelvic pain because of the weight gain and pregnancy hormones relaxing your joints between the bones in your pelvis. Backaches may result from overexertion of the muscles supporting your posture. °· You may have changes in your hair. These can include thickening of your hair, rapid growth, and changes in texture. Some women also have hair loss during or after pregnancy, or hair that feels dry or thin. Your hair will most likely return to normal after your baby is born. °· Your breasts will continue to grow and be tender. A yellow discharge may leak from your breasts called colostrum. °· Your belly button may stick out. °· You may feel short of breath because of your expanding uterus. °· You may notice the fetus "dropping," or moving lower in your abdomen. °· You may have a bloody mucus discharge. This usually occurs a few days to a week before labor begins. °· Your cervix becomes thin and soft (effaced) near your due date. °WHAT TO EXPECT AT YOUR PRENATAL  EXAMS  °You will have prenatal exams every 2 weeks until week 36. Then, you will have weekly prenatal exams. During a routine prenatal visit: °· You will be weighed to make sure you and the fetus are growing normally. °· Your blood pressure is taken. °· Your abdomen will be measured to track your baby's growth. °· The fetal heartbeat will be listened to. °· Any test results from the previous visit will be discussed. °· You may have a cervical check near your due date to see if you have effaced. °At around 36 weeks, your caregiver will check your cervix. At the same time, your caregiver will also perform a test on the secretions of the vaginal tissue. This test is to determine if a type of bacteria, Group B streptococcus, is present. Your caregiver will explain this further. °Your caregiver may ask you: °· What your birth plan is. °· How you are feeling. °· If you are feeling the baby move. °· If you have had any abnormal symptoms, such as leaking fluid, bleeding, severe headaches, or abdominal cramping. °· If you are using any tobacco products, including cigarettes, chewing tobacco, and electronic cigarettes. °· If you have any questions. °Other tests or screenings that may be performed during your third trimester include: °· Blood tests that check for low iron levels (anemia). °· Fetal testing to check the health, activity level, and growth of the fetus. Testing is done if you have certain medical conditions or if   there are problems during the pregnancy. °· HIV (human immunodeficiency virus) testing. If you are at high risk, you may be screened for HIV during your third trimester of pregnancy. °FALSE LABOR °You may feel small, irregular contractions that eventually go away. These are called Braxton Hicks contractions, or false labor. Contractions may last for hours, days, or even weeks before true labor sets in. If contractions come at regular intervals, intensify, or become painful, it is best to be seen by your  caregiver.  °SIGNS OF LABOR  °· Menstrual-like cramps. °· Contractions that are 5 minutes apart or less. °· Contractions that start on the top of the uterus and spread down to the lower abdomen and back. °· A sense of increased pelvic pressure or back pain. °· A watery or bloody mucus discharge that comes from the vagina. °If you have any of these signs before the 37th week of pregnancy, call your caregiver right away. You need to go to the hospital to get checked immediately. °HOME CARE INSTRUCTIONS  °· Avoid all smoking, herbs, alcohol, and unprescribed drugs. These chemicals affect the formation and growth of the baby. °· Do not use any tobacco products, including cigarettes, chewing tobacco, and electronic cigarettes. If you need help quitting, ask your health care provider. You may receive counseling support and other resources to help you quit. °· Follow your caregiver's instructions regarding medicine use. There are medicines that are either safe or unsafe to take during pregnancy. °· Exercise only as directed by your caregiver. Experiencing uterine cramps is a good sign to stop exercising. °· Continue to eat regular, healthy meals. °· Wear a good support bra for breast tenderness. °· Do not use hot tubs, steam rooms, or saunas. °· Wear your seat belt at all times when driving. °· Avoid raw meat, uncooked cheese, cat litter boxes, and soil used by cats. These carry germs that can cause birth defects in the baby. °· Take your prenatal vitamins. °· Take 1500-2000 mg of calcium daily starting at the 20th week of pregnancy until you deliver your baby. °· Try taking a stool softener (if your caregiver approves) if you develop constipation. Eat more high-fiber foods, such as fresh vegetables or fruit and whole grains. Drink plenty of fluids to keep your urine clear or pale yellow. °· Take warm sitz baths to soothe any pain or discomfort caused by hemorrhoids. Use hemorrhoid cream if your caregiver approves. °· If  you develop varicose veins, wear support hose. Elevate your feet for 15 minutes, 3-4 times a day. Limit salt in your diet. °· Avoid heavy lifting, wear low heal shoes, and practice good posture. °· Rest a lot with your legs elevated if you have leg cramps or low back pain. °· Visit your dentist if you have not gone during your pregnancy. Use a soft toothbrush to brush your teeth and be gentle when you floss. °· A sexual relationship may be continued unless your caregiver directs you otherwise. °· Do not travel far distances unless it is absolutely necessary and only with the approval of your caregiver. °· Take prenatal classes to understand, practice, and ask questions about the labor and delivery. °· Make a trial run to the hospital. °· Pack your hospital bag. °· Prepare the baby's nursery. °· Continue to go to all your prenatal visits as directed by your caregiver. °SEEK MEDICAL CARE IF: °· You are unsure if you are in labor or if your water has broken. °· You have dizziness. °· You have   mild pelvic cramps, pelvic pressure, or nagging pain in your abdominal area. °· You have persistent nausea, vomiting, or diarrhea. °· You have a bad smelling vaginal discharge. °· You have pain with urination. °SEEK IMMEDIATE MEDICAL CARE IF:  °· You have a fever. °· You are leaking fluid from your vagina. °· You have spotting or bleeding from your vagina. °· You have severe abdominal cramping or pain. °· You have rapid weight loss or gain. °· You have shortness of breath with chest pain. °· You notice sudden or extreme swelling of your face, hands, ankles, feet, or legs. °· You have not felt your baby move in over an hour. °· You have severe headaches that do not go away with medicine. °· You have vision changes. °  °This information is not intended to replace advice given to you by your health care provider. Make sure you discuss any questions you have with your health care provider. °  °Document Released: 09/30/2001 Document  Revised: 10/27/2014 Document Reviewed: 12/07/2012 °Elsevier Interactive Patient Education ©2016 [employer] °Fetal Movement Counts °Patient Name: __________________________________________________ Patient Due Date: ____________________ °Performing a fetal movement count is highly recommended in high-risk pregnancies, but it is good for every pregnant woman to do. Your health care provider may ask you to start counting fetal movements at 28 weeks of the pregnancy. Fetal movements often increase: °After eating a full meal. °After physical activity. °After eating or drinking something sweet or cold. °At rest. °Pay attention to when you feel the baby is most active. This will help you notice a pattern of your baby's sleep and wake cycles and what factors contribute to an increase in fetal movement. It is important to perform a fetal movement count at the same time each day when your baby is normally most active.  °HOW TO COUNT FETAL MOVEMENTS °Find a quiet and comfortable area to sit or lie down on your left side. Lying on your left side provides the best blood and oxygen circulation to your baby. °Write down the day and time on a sheet of paper or in a journal. °Start counting kicks, flutters, swishes, rolls, or jabs in a 2-hour period. You should feel at least 10 movements within 2 hours. °If you do not feel 10 movements in 2 hours, wait 2-3 hours and count again. Look for a change in the pattern or not enough counts in 2 hours. °SEEK MEDICAL CARE IF: °You feel less than 10 counts in 2 hours, tried twice. °There is no movement in over an hour. °The pattern is changing or taking longer each day to reach 10 counts in 2 hours. °You feel the baby is not moving as he or she usually does. °Date: ____________ Movements: ____________ Start time: ____________ Finish time: ____________  °Date: ____________ Movements: ____________ Start time: ____________ Finish time: ____________ °Date: ____________ Movements: ____________  Start time: ____________ Finish time: ____________ °Date: ____________ Movements: ____________ Start time: ____________ Finish time: ____________ °Date: ____________ Movements: ____________ Start time: ____________ Finish time: ____________ °Date: ____________ Movements: ____________ Start time: ____________ Finish time: ____________ °Date: ____________ Movements: ____________ Start time: ____________ Finish time: ____________ °Date: ____________ Movements: ____________ Start time: ____________ Finish time: ____________  °Date: ____________ Movements: ____________ Start time: ____________ Finish time: ____________ °Date: ____________ Movements: ____________ Start time: ____________ Finish time: ____________ °Date: ____________ Movements: ____________ Start time: ____________ Finish time: ____________ °Date: ____________ Movements: ____________ Start time: ____________ Finish time: ____________ °Date: ____________ Movements: ____________ Start time: ____________ Finish time: ____________ °Date: ____________ Movements:   ____________ Start time: ____________ Finish time: ____________ °Date: ____________ Movements: ____________ Start time: ____________ Finish time: ____________  °Date: ____________ Movements: ____________ Start time: ____________ Finish time: ____________ °Date: ____________ Movements: ____________ Start time: ____________ Finish time: ____________ °Date: ____________ Movements: ____________ Start time: ____________ Finish time: ____________ °Date: ____________ Movements: ____________ Start time: ____________ Finish time: ____________ °Date: ____________ Movements: ____________ Start time: ____________ Finish time: ____________ °Date: ____________ Movements: ____________ Start time: ____________ Finish time: ____________ °Date: ____________ Movements: ____________ Start time: ____________ Finish time: ____________  °Date: ____________ Movements: ____________ Start time: ____________ Finish time:  ____________ °Date: ____________ Movements: ____________ Start time: ____________ Finish time: ____________ °Date: ____________ Movements: ____________ Start time: ____________ Finish time: ____________ °Date: ____________ Movements: ____________ Start time: ____________ Finish time: ____________ °Date: ____________ Movements: ____________ Start time: ____________ Finish time: ____________ °Date: ____________ Movements: ____________ Start time: ____________ Finish time: ____________ °Date: ____________ Movements: ____________ Start time: ____________ Finish time: ____________  °Date: ____________ Movements: ____________ Start time: ____________ Finish time: ____________ °Date: ____________ Movements: ____________ Start time: ____________ Finish time: ____________ °Date: ____________ Movements: ____________ Start time: ____________ Finish time: ____________ °Date: ____________ Movements: ____________ Start time: ____________ Finish time: ____________ °Date: ____________ Movements: ____________ Start time: ____________ Finish time: ____________ °Date: ____________ Movements: ____________ Start time: ____________ Finish time: ____________ °Date: ____________ Movements: ____________ Start time: ____________ Finish time: ____________  °Date: ____________ Movements: ____________ Start time: ____________ Finish time: ____________ °Date: ____________ Movements: ____________ Start time: ____________ Finish time: ____________ °Date: ____________ Movements: ____________ Start time: ____________ Finish time: ____________ °Date: ____________ Movements: ____________ Start time: ____________ Finish time: ____________ °Date: ____________ Movements: ____________ Start time: ____________ Finish time: ____________ °Date: ____________ Movements: ____________ Start time: ____________ Finish time: ____________ °Date: ____________ Movements: ____________ Start time: ____________ Finish time: ____________  °Date: ____________ Movements:  ____________ Start time: ____________ Finish time: ____________ °Date: ____________ Movements: ____________ Start time: ____________ Finish time: ____________ °Date: ____________ Movements: ____________ Start time: ____________ Finish time: ____________ °Date: ____________ Movements: ____________ Start time: ____________ Finish time: ____________ °Date: ____________ Movements: ____________ Start time: ____________ Finish time: ____________ °Date: ____________ Movements: ____________ Start time: ____________ Finish time: ____________ °Date: ____________ Movements: ____________ Start time: ____________ Finish time: ____________  °Date: ____________ Movements: ____________ Start time: ____________ Finish time: ____________ °Date: ____________ Movements: ____________ Start time: ____________ Finish time: ____________ °Date: ____________ Movements: ____________ Start time: ____________ Finish time: ____________ °Date: ____________ Movements: ____________ Start time: ____________ Finish time: ____________ °Date: ____________ Movements: ____________ Start time: ____________ Finish time: ____________ °Date: ____________ Movements: ____________ Start time: ____________ Finish time: ____________ °  °This information is not intended to replace advice given to you by your health care provider. Make sure you discuss any questions you have with your health care provider. °  °Document Released: 11/05/2006 Document Revised: 10/27/2014 Document Reviewed: 08/02/2012 °Elsevier Interactive Patient Education ©2016 [employer] ° °

## 2015-08-09 NOTE — MAU Note (Signed)
Pt presents to MAU with complaints of vaginal bleeding that started this morning when she woke up. Denies any contractions. Reports good fetal movement

## 2015-08-27 ENCOUNTER — Other Ambulatory Visit (HOSPITAL_COMMUNITY): Payer: Self-pay | Admitting: Obstetrics and Gynecology

## 2015-08-29 ENCOUNTER — Telehealth (HOSPITAL_COMMUNITY): Payer: Self-pay | Admitting: *Deleted

## 2015-08-29 ENCOUNTER — Encounter (HOSPITAL_COMMUNITY): Payer: Self-pay | Admitting: *Deleted

## 2015-08-29 LAB — OB RESULTS CONSOLE GBS: GBS: NEGATIVE

## 2015-08-29 NOTE — Telephone Encounter (Signed)
Preadmission screen  

## 2015-08-31 ENCOUNTER — Encounter (HOSPITAL_COMMUNITY): Payer: Self-pay | Admitting: *Deleted

## 2015-08-31 NOTE — Telephone Encounter (Signed)
Preadmission screen  

## 2015-09-03 ENCOUNTER — Inpatient Hospital Stay (HOSPITAL_COMMUNITY): Payer: Federal, State, Local not specified - PPO | Admitting: Anesthesiology

## 2015-09-03 ENCOUNTER — Encounter (HOSPITAL_COMMUNITY): Payer: Self-pay

## 2015-09-03 ENCOUNTER — Inpatient Hospital Stay (HOSPITAL_COMMUNITY)
Admission: RE | Admit: 2015-09-03 | Discharge: 2015-09-05 | DRG: 775 | Disposition: A | Discharge status: Home / Self Care | Payer: Federal, State, Local not specified - PPO | Source: Ambulatory Visit | Attending: Obstetrics and Gynecology | Admitting: Obstetrics and Gynecology

## 2015-09-03 DIAGNOSIS — O99214 Obesity complicating childbirth: Secondary | ICD-10-CM | POA: Diagnosis present

## 2015-09-03 DIAGNOSIS — O9902 Anemia complicating childbirth: Secondary | ICD-10-CM | POA: Diagnosis present

## 2015-09-03 DIAGNOSIS — Z3A41 41 weeks gestation of pregnancy: Secondary | ICD-10-CM

## 2015-09-03 DIAGNOSIS — O9962 Diseases of the digestive system complicating childbirth: Secondary | ICD-10-CM | POA: Diagnosis present

## 2015-09-03 DIAGNOSIS — Z3490 Encounter for supervision of normal pregnancy, unspecified, unspecified trimester: Secondary | ICD-10-CM

## 2015-09-03 DIAGNOSIS — E669 Obesity, unspecified: Secondary | ICD-10-CM | POA: Diagnosis present

## 2015-09-03 DIAGNOSIS — O480 Post-term pregnancy: Secondary | ICD-10-CM | POA: Diagnosis present

## 2015-09-03 DIAGNOSIS — Z6839 Body mass index (BMI) 39.0-39.9, adult: Secondary | ICD-10-CM

## 2015-09-03 DIAGNOSIS — K219 Gastro-esophageal reflux disease without esophagitis: Secondary | ICD-10-CM | POA: Diagnosis present

## 2015-09-03 LAB — TYPE AND SCREEN
ABO/RH(D): O POS
Antibody Screen: NEGATIVE

## 2015-09-03 LAB — CBC
HCT: 34.5 % — ABNORMAL LOW (ref 36.0–46.0)
Hemoglobin: 11.4 g/dL — ABNORMAL LOW (ref 12.0–15.0)
MCH: 28.4 pg (ref 26.0–34.0)
MCHC: 33 g/dL (ref 30.0–36.0)
MCV: 85.8 fL (ref 78.0–100.0)
PLATELETS: 174 10*3/uL (ref 150–400)
RBC: 4.02 MIL/uL (ref 3.87–5.11)
RDW: 14.7 % (ref 11.5–15.5)
WBC: 11.6 10*3/uL — ABNORMAL HIGH (ref 4.0–10.5)

## 2015-09-03 LAB — RPR: RPR: NONREACTIVE

## 2015-09-03 MED ORDER — MISOPROSTOL 25 MCG QUARTER TABLET
25.0000 ug | ORAL_TABLET | ORAL | Status: DC | PRN
  Administered 2015-09-03 (×2): 25 ug via VAGINAL
  Filled 2015-09-03 (×2): qty 0.25
  Filled 2015-09-03: qty 1

## 2015-09-03 MED ORDER — CITRIC ACID-SODIUM CITRATE 334-500 MG/5ML PO SOLN
30.0000 mL | ORAL | Status: DC | PRN
  Administered 2015-09-03: 30 mL via ORAL
  Filled 2015-09-03: qty 15

## 2015-09-03 MED ORDER — LACTATED RINGERS IV SOLN
INTRAVENOUS | Status: DC
  Administered 2015-09-03 (×4): via INTRAVENOUS

## 2015-09-03 MED ORDER — BUTORPHANOL TARTRATE 1 MG/ML IJ SOLN: 1.0000 mg | INTRAMUSCULAR | Status: DC | PRN

## 2015-09-03 MED ORDER — OXYTOCIN 40 UNITS IN LACTATED RINGERS INFUSION - SIMPLE MED
62.5000 mL/h | INTRAVENOUS | Status: DC
  Filled 2015-09-03: qty 1000

## 2015-09-03 MED ORDER — OXYTOCIN 40 UNITS IN LACTATED RINGERS INFUSION - SIMPLE MED
1.0000 m[IU]/min | INTRAVENOUS | Status: DC
  Administered 2015-09-03: 2 m[IU]/min via INTRAVENOUS
  Administered 2015-09-03: 16 m[IU]/min via INTRAVENOUS

## 2015-09-03 MED ORDER — TERBUTALINE SULFATE 1 MG/ML IJ SOLN
0.2500 mg | Freq: Once | INTRAMUSCULAR | Status: DC | PRN
  Filled 2015-09-03: qty 1

## 2015-09-03 MED ORDER — LIDOCAINE HCL (PF) 1 % IJ SOLN
30.0000 mL | INTRAMUSCULAR | Status: DC | PRN
  Filled 2015-09-03: qty 30

## 2015-09-03 MED ORDER — DIPHENHYDRAMINE HCL 50 MG/ML IJ SOLN
12.5000 mg | INTRAMUSCULAR | Status: DC | PRN
  Administered 2015-09-03: 12.5 mg via INTRAVENOUS
  Filled 2015-09-03: qty 1

## 2015-09-03 MED ORDER — PHENYLEPHRINE 40 MCG/ML (10ML) SYRINGE FOR IV PUSH (FOR BLOOD PRESSURE SUPPORT)
80.0000 ug | PREFILLED_SYRINGE | INTRAVENOUS | Status: DC | PRN
  Filled 2015-09-03: qty 20
  Filled 2015-09-03: qty 2

## 2015-09-03 MED ORDER — LIDOCAINE HCL (PF) 1 % IJ SOLN
INTRAMUSCULAR | Status: DC | PRN
  Administered 2015-09-03 (×2): 4 mL via EPIDURAL

## 2015-09-03 MED ORDER — FENTANYL 2.5 MCG/ML BUPIVACAINE 1/10 % EPIDURAL INFUSION (WH - ANES)
14.0000 mL/h | INTRAMUSCULAR | Status: DC | PRN
  Administered 2015-09-03: 14 mL/h via EPIDURAL
  Filled 2015-09-03 (×2): qty 125

## 2015-09-03 MED ORDER — OXYCODONE-ACETAMINOPHEN 5-325 MG PO TABS: 1.0000 | ORAL_TABLET | ORAL | Status: DC | PRN

## 2015-09-03 MED ORDER — OXYTOCIN BOLUS FROM INFUSION: 500.0000 mL | INTRAVENOUS | Status: DC

## 2015-09-03 MED ORDER — ACETAMINOPHEN 325 MG PO TABS: 650.0000 mg | ORAL_TABLET | ORAL | Status: DC | PRN

## 2015-09-03 MED ORDER — ZOLPIDEM TARTRATE 5 MG PO TABS: 5.0000 mg | ORAL_TABLET | Freq: Every evening | ORAL | Status: DC | PRN

## 2015-09-03 MED ORDER — ONDANSETRON HCL 4 MG/2ML IJ SOLN
4.0000 mg | Freq: Four times a day (QID) | INTRAMUSCULAR | Status: DC | PRN
  Administered 2015-09-03: 4 mg via INTRAVENOUS
  Filled 2015-09-03: qty 2

## 2015-09-03 MED ORDER — LACTATED RINGERS IV SOLN: 500.0000 mL | INTRAVENOUS | Status: DC | PRN

## 2015-09-03 MED ORDER — OXYCODONE-ACETAMINOPHEN 5-325 MG PO TABS: 2.0000 | ORAL_TABLET | ORAL | Status: DC | PRN

## 2015-09-03 MED ORDER — EPHEDRINE 5 MG/ML INJ
10.0000 mg | INTRAVENOUS | Status: DC | PRN
  Filled 2015-09-03: qty 2

## 2015-09-03 NOTE — Progress Notes (Signed)
FHTs 120s gSTV, NST R.  Mild variables in early position, occ moderate.  Cat 2 tracing.  Toco q 3-4  SVE 5/100/-2 , IUPC placed.  Continue.

## 2015-09-03 NOTE — Anesthesia Preprocedure Evaluation (Signed)
Anesthesia Evaluation  Patient identified by MRN, date of birth, ID band Patient awake    Reviewed: Allergy & Precautions, Patient's Chart, lab work & pertinent test results  History of Anesthesia Complications (+) PONV and history of anesthetic complications  Airway Mallampati: III  TM Distance: >3 FB Neck ROM: Full    Dental no notable dental hx. (+) Teeth Intact   Pulmonary neg pulmonary ROS,    Pulmonary exam normal breath sounds clear to auscultation       Cardiovascular negative cardio ROS Normal cardiovascular exam Rhythm:Regular Rate:Normal     Neuro/Psych negative neurological ROS  negative psych ROS   GI/Hepatic Neg liver ROS, GERD  Medicated and Controlled,  Endo/Other  Obesity  Renal/GU negative Renal ROS  negative genitourinary   Musculoskeletal negative musculoskeletal ROS (+)   Abdominal (+) + obese,   Peds  Hematology  (+) anemia ,   Anesthesia Other Findings   Reproductive/Obstetrics (+) Pregnancy                             Anesthesia Physical Anesthesia Plan  ASA: II  Anesthesia Plan: Epidural   Post-op Pain Management:    Induction:   Airway Management Planned: Natural Airway  Additional Equipment:   Intra-op Plan:   Post-operative Plan:   Informed Consent: I have reviewed the patients History and Physical, chart, labs and discussed the procedure including the risks, benefits and alternatives for the proposed anesthesia with the patient or authorized representative who has indicated his/her understanding and acceptance.     Plan Discussed with: Anesthesiologist  Anesthesia Plan Comments:         Anesthesia Quick Evaluation

## 2015-09-03 NOTE — H&P (Signed)
24 y.o. 3276w0d  G2P1001 comes in for induction post dates.  Otherwise has good fetal movement and no bleeding.  Past Medical History  Diagnosis Date  . No pertinent past medical history   . H/O varicella   . PONV (postoperative nausea and vomiting)     Past Surgical History  Procedure Laterality Date  . Anterior cruciate ligament repair      left knee  . Wisdom tooth extraction      OB History  Gravida Para Term Preterm AB SAB TAB Ectopic Multiple Living  2 1 1  0 0 0 0 0 0 1    # Outcome Date GA Lbr Len/2nd Weight Sex Delivery Anes PTL Lv  2 Current           1 Term 10/25/12 1545w2d 18:04 / 00:17 3.435 kg (7 lb 9.2 oz) F Vag-Spont EPI  Y      Social History   Social History  . Marital Status: Married    Spouse Name: N/A  . Number of Children: N/A  . Years of Education: N/A   Occupational History  . Not on file.   Social History Main Topics  . Smoking status: Never Smoker   . Smokeless tobacco: Never Used  . Alcohol Use: No  . Drug Use: No  . Sexual Activity: Yes   Other Topics Concern  . Not on file   Social History Narrative   Cephalexin    Prenatal Transfer Tool  Maternal Diabetes: No Genetic Screening: Normal Maternal Ultrasounds/Referrals: Normal Fetal Ultrasounds or other Referrals:  None Maternal Substance Abuse:  No Significant Maternal Medications:  None Significant Maternal Lab Results: None  Other PNC: uncomplicated.    Filed Vitals:   09/03/15 0602  BP: 108/54  Pulse: 78  Temp: 98 F (36.7 C)  Resp: 18     Lungs/Cor:  NAD Abdomen:  soft, gravid Ex:  no cords, erythema SVE:  1/40/-3 FHTs:  130s, good STV, NST R Toco:  q20   A/P   Post dates induction.  GBS neg.  Sandra Saunders A

## 2015-09-03 NOTE — Anesthesia Procedure Notes (Signed)
Epidural Patient location during procedure: OB Start time: 09/03/2015 10:50 AM  Staffing Anesthesiologist: Mal AmabileFOSTER, Selyna Klahn Performed by: anesthesiologist   Preanesthetic Checklist Completed: patient identified, site marked, surgical consent, pre-op evaluation, timeout performed, IV checked, risks and benefits discussed and monitors and equipment checked  Epidural Patient position: sitting Prep: site prepped and draped and DuraPrep Patient monitoring: continuous pulse ox and blood pressure Approach: midline Location: L3-L4 Injection technique: LOR air  Needle:  Needle type: Tuohy  Needle gauge: 17 G Needle length: 9 cm and 9 Needle insertion depth: 6 cm Catheter type: closed end flexible Catheter size: 19 Gauge Catheter at skin depth: 11 cm Test dose: negative and Other  Assessment Events: blood not aspirated, injection not painful, no injection resistance, negative IV test and no paresthesia  Additional Notes Patient identified. Risks and benefits discussed including failed block, incomplete  Pain control, post dural puncture headache, nerve damage, paralysis, blood pressure Changes, nausea, vomiting, reactions to medications-both toxic and allergic and post Partum back pain. All questions were answered. Patient expressed understanding and wished to proceed. Sterile technique was used throughout procedure. Epidural site was Dressed with sterile barrier dressing. No paresthesias, signs of intravascular injection Or signs of intrathecal spread were encountered.  Patient was more comfortable after the epidural was dosed. Please see RN's note for documentation of vital signs and FHR which are stable.

## 2015-09-03 NOTE — Plan of Care (Signed)
Problem: Pain Managment: Goal: General experience of comfort will improve Outcome: Progressing Pt states 0/10 pain scale, will use epidural button prn

## 2015-09-04 ENCOUNTER — Encounter (HOSPITAL_COMMUNITY): Payer: Self-pay

## 2015-09-04 LAB — CBC
HEMATOCRIT: 32.8 % — AB (ref 36.0–46.0)
HEMOGLOBIN: 10.7 g/dL — AB (ref 12.0–15.0)
MCH: 28.2 pg (ref 26.0–34.0)
MCHC: 32.6 g/dL (ref 30.0–36.0)
MCV: 86.5 fL (ref 78.0–100.0)
Platelets: 157 10*3/uL (ref 150–400)
RBC: 3.79 MIL/uL — AB (ref 3.87–5.11)
RDW: 14.8 % (ref 11.5–15.5)
WBC: 18.5 10*3/uL — AB (ref 4.0–10.5)

## 2015-09-04 LAB — CCBB MATERNAL DONOR DRAW

## 2015-09-04 MED ORDER — BENZOCAINE-MENTHOL 20-0.5 % EX AERO
1.0000 "application " | INHALATION_SPRAY | CUTANEOUS | Status: DC | PRN
  Administered 2015-09-04: 1 via TOPICAL
  Filled 2015-09-04: qty 56

## 2015-09-04 MED ORDER — IBUPROFEN 800 MG PO TABS
800.0000 mg | ORAL_TABLET | Freq: Three times a day (TID) | ORAL | Status: DC
  Administered 2015-09-04 – 2015-09-05 (×5): 800 mg via ORAL
  Filled 2015-09-04 (×5): qty 1

## 2015-09-04 MED ORDER — FERROUS SULFATE 325 (65 FE) MG PO TABS
325.0000 mg | ORAL_TABLET | Freq: Two times a day (BID) | ORAL | Status: DC
  Administered 2015-09-04 – 2015-09-05 (×3): 325 mg via ORAL
  Filled 2015-09-04 (×3): qty 1

## 2015-09-04 MED ORDER — ONDANSETRON HCL 4 MG/2ML IJ SOLN: 4.0000 mg | INTRAMUSCULAR | Status: DC | PRN

## 2015-09-04 MED ORDER — METHYLERGONOVINE MALEATE 0.2 MG/ML IJ SOLN: 0.2000 mg | INTRAMUSCULAR | Status: DC | PRN

## 2015-09-04 MED ORDER — SODIUM CHLORIDE 0.9 % IJ SOLN: 3.0000 mL | Freq: Two times a day (BID) | INTRAMUSCULAR | Status: DC

## 2015-09-04 MED ORDER — ONDANSETRON HCL 4 MG PO TABS: 4.0000 mg | ORAL_TABLET | ORAL | Status: DC | PRN

## 2015-09-04 MED ORDER — SENNOSIDES-DOCUSATE SODIUM 8.6-50 MG PO TABS
2.0000 | ORAL_TABLET | ORAL | Status: DC
  Administered 2015-09-04: 2 via ORAL
  Filled 2015-09-04: qty 2

## 2015-09-04 MED ORDER — SODIUM CHLORIDE 0.9 % IV SOLN: 250.0000 mL | INTRAVENOUS | Status: DC | PRN

## 2015-09-04 MED ORDER — SODIUM CHLORIDE 0.9 % IJ SOLN
3.0000 mL | INTRAMUSCULAR | Status: DC | PRN
Start: 2015-09-04 — End: 2015-09-04

## 2015-09-04 MED ORDER — ACETAMINOPHEN 325 MG PO TABS: 650.0000 mg | ORAL_TABLET | ORAL | Status: DC | PRN

## 2015-09-04 MED ORDER — PRENATAL MULTIVITAMIN CH
1.0000 | ORAL_TABLET | Freq: Every day | ORAL | Status: DC
  Administered 2015-09-04: 1 via ORAL
  Filled 2015-09-04: qty 1

## 2015-09-04 MED ORDER — SIMETHICONE 80 MG PO CHEW: 80.0000 mg | CHEWABLE_TABLET | ORAL | Status: DC | PRN

## 2015-09-04 MED ORDER — MAGNESIUM HYDROXIDE 400 MG/5ML PO SUSP: 30.0000 mL | ORAL | Status: DC | PRN

## 2015-09-04 MED ORDER — LANOLIN HYDROUS EX OINT: TOPICAL_OINTMENT | CUTANEOUS | Status: DC | PRN

## 2015-09-04 MED ORDER — OXYCODONE-ACETAMINOPHEN 5-325 MG PO TABS
2.0000 | ORAL_TABLET | ORAL | Status: DC | PRN
Start: 2015-09-04 — End: 2015-09-05

## 2015-09-04 MED ORDER — ZOLPIDEM TARTRATE 5 MG PO TABS: 5.0000 mg | ORAL_TABLET | Freq: Every evening | ORAL | Status: DC | PRN

## 2015-09-04 MED ORDER — DIPHENHYDRAMINE HCL 25 MG PO CAPS: 25.0000 mg | ORAL_CAPSULE | Freq: Four times a day (QID) | ORAL | Status: DC | PRN

## 2015-09-04 MED ORDER — TETANUS-DIPHTH-ACELL PERTUSSIS 5-2.5-18.5 LF-MCG/0.5 IM SUSP: 0.5000 mL | Freq: Once | INTRAMUSCULAR | Status: DC

## 2015-09-04 MED ORDER — OXYCODONE-ACETAMINOPHEN 5-325 MG PO TABS: 1.0000 | ORAL_TABLET | ORAL | Status: DC | PRN

## 2015-09-04 MED ORDER — METHYLERGONOVINE MALEATE 0.2 MG PO TABS: 0.2000 mg | ORAL_TABLET | ORAL | Status: DC | PRN

## 2015-09-04 MED ORDER — DIBUCAINE 1 % RE OINT: 1.0000 "application " | TOPICAL_OINTMENT | RECTAL | Status: DC | PRN

## 2015-09-04 MED ORDER — WITCH HAZEL-GLYCERIN EX PADS
1.0000 | MEDICATED_PAD | CUTANEOUS | Status: DC | PRN
Start: 2015-09-04 — End: 2015-09-05

## 2015-09-04 MED ORDER — MEASLES, MUMPS & RUBELLA VAC ~~LOC~~ INJ
0.5000 mL | INJECTION | Freq: Once | SUBCUTANEOUS | Status: DC
  Filled 2015-09-04: qty 0.5

## 2015-09-04 NOTE — Anesthesia Postprocedure Evaluation (Signed)
  Anesthesia Post-op Note  Patient: Reggie PileKendra L Depaul  Procedure(s) Performed: * No procedures listed *  Patient Location: Mother/Baby  Anesthesia Type:Epidural  Level of Consciousness: awake, alert  and oriented  Airway and Oxygen Therapy: Patient Spontanous Breathing  Post-op Pain: none  Post-op Assessment: Post-op Vital signs reviewed, Patient's Cardiovascular Status Stable, Respiratory Function Stable, Patent Airway, No signs of Nausea or vomiting, Adequate PO intake, Pain level controlled, No headache, No backache and Patient able to bend at knees              Post-op Vital Signs: Reviewed and stable  Last Vitals:  Filed Vitals:   09/04/15 0608  BP: 98/55  Pulse: 72  Temp: 37 C  Resp: 18    Complications: No apparent anesthesia complications

## 2015-09-04 NOTE — Lactation Note (Signed)
This note was copied from the chart of Sandra Debe CoderKendra Umbarger. Lactation Consultation Note: Initial visit with mom. She reports baby has just finished feeding and her nipples are sore. Nipples pink especially the left one. Mom reports pain throughout feeding. Baby still rooting so assisted mom with latching baby. Mom reports pain- baby is tucking bottom lip under. Untucked bottom lip and mom reports that feels much better. Baby nursed for 10 more min then off to sleep. Comfort gels given with instructions for use. No questions at present, To call for assist prn  Patient Name: Sandra Saunders WGNFA'OToday's Date: 09/04/2015 Reason for consult: Initial assessment   Maternal Data Formula Feeding for Exclusion: No Has patient been taught Hand Expression?: Yes Does the patient have breastfeeding experience prior to this delivery?: Yes  Feeding Feeding Type: Breast Fed Length of feed: 10 min  LATCH Score/Interventions Latch: Grasps breast easily, tongue down, lips flanged, rhythmical sucking. Intervention(s): Adjust position;Assist with latch;Breast massage;Breast compression  Audible Swallowing: A few with stimulation Intervention(s): Hand expression  Type of Nipple: Everted at rest and after stimulation  Comfort (Breast/Nipple): Filling, red/small blisters or bruises, mild/mod discomfort  Problem noted: Mild/Moderate discomfort Interventions (Mild/moderate discomfort): Comfort gels  Hold (Positioning): Assistance needed to correctly position infant at breast and maintain latch. Intervention(s): Breastfeeding basics reviewed  LATCH Score: 7  Lactation Tools Discussed/Used     Consult Status Consult Status: Follow-up Date: 09/05/15 Follow-up type: In-patient    Pamelia HoitWeeks, Burdell Peed D 09/04/2015, 2:44 PM

## 2015-09-04 NOTE — Progress Notes (Signed)
PPD#1 Pt without complaints. Wants a circ. Peds has not seen baby yet. VSSAF IMP/ Stable Plan/ Routine care.

## 2015-09-05 MED ORDER — IBUPROFEN 800 MG PO TABS: 800.0000 mg | ORAL_TABLET | Freq: Three times a day (TID) | ORAL | Status: DC

## 2015-09-05 NOTE — Lactation Note (Signed)
This note was copied from the chart of Sandra Debe CoderKendra Roseboom. Lactation Consultation Note Mom crying, painful nipples and latches, baby cluster feeding. Hand expressed 3ml colostrum and gave in syring w/gloved finger. Has strong suckle, tight. Suck training massaging tongue down to relax for easier suckle. Mom wearing comfort gels. Has good everted nipples, wearing supportive nursing bra. Has transitional colostrum/milk. Baby has recessed chin, encouraged to continue to do gentle chin tug to make sure that chin and bottom lip is pulled out. Discussed angles for feeding to have chin closer to breast for deeper latch. discussed cluster feeding, supply and demand, and hormones with mom and dad.  Patient Name: Sandra Saunders YNWGN'FToday's Date: 09/05/2015 Reason for consult: Follow-up assessment;Breast/nipple pain   Maternal Data    Feeding Feeding Type: Breast Milk Length of feed: 5 min  LATCH Score/Interventions    Intervention(s): Hand expression;Alternate breast massage  Type of Nipple: Everted at rest and after stimulation  Comfort (Breast/Nipple): Filling, red/small blisters or bruises, mild/mod discomfort  Problem noted: Mild/Moderate discomfort Interventions (Mild/moderate discomfort): Comfort gels        Lactation Tools Discussed/Used Tools: Comfort gels   Consult Status Consult Status: Follow-up Date: 09/05/15 Follow-up type: In-patient    Chantale Leugers, Diamond NickelLAURA G 09/05/2015, 4:03 AM

## 2015-09-05 NOTE — Lactation Note (Addendum)
This note was copied from the chart of Boy Sandra CoderKendra Blanchard. Lactation Consultation Note  Patient Name: [name] XBJYN'WToday's Date: 09/05/2015   Visited with Mom and FOB on day of discharge, baby 35 hrs and just returned from [facility] from having circumcision.  Baby swaddled sleeping in crib.  Mom and Dad resting also.  Mom states baby is latching well primarily on the right side, Latch scores have been 7.  She reports a blister on the left side that she would rather express milk from than latch baby right now.  Offered baby some colostrum via finger feedings.  Offered assistance at next feeding prior to discharge, and also to schedule a follow-up OP appointment.  Mom prefers to wait and see how she is doing and will call prn.  Encouraged skin to skin, and cue based feedings at home.  Colostrum to nipple post feedings, also has Comfort Gels. Engorgement prevention and treatment discussed.  Reminded Mom of OP Support Groups available. To call prn.     Judee ClaraSmith, Isra Lindy E 09/05/2015, 10:08 AM

## 2015-09-05 NOTE — Progress Notes (Signed)
Patient is doing well.  She is ambulating, voiding, tolerating PO.  Pain control is good.  Lochia is appropriate  Filed Vitals:   09/04/15 0608 09/04/15 1415 09/04/15 1811 09/05/15 0618  BP: 98/55 117/63 105/65 99/61  Pulse: 72 83 82 59  Temp: 98.6 F (37 C) 97.9 F (36.6 C) 97.7 F (36.5 C) 98.9 F (37.2 C)  TempSrc: Oral Oral Oral Oral  Resp: 18 18 18 16   Height:      Weight:      SpO2:  100%      NAD Fundus firm Ext: trace edema b/l  Lab Results  Component Value Date   WBC 18.5* 09/04/2015   HGB 10.7* 09/04/2015   HCT 32.8* 09/04/2015   MCV 86.5 09/04/2015   PLT 157 09/04/2015    --/--/O POS (11/14 0110)/RImmune  A/P 24 y.o. [id number] PPD#2 s/p TSVD. Routine care.   Meeting all goals.  D/C today. Desires circumcision. Discussed r/b/a of the procedure. Reviewed that circumcision is an elective surgical procedure and not considered medically necessary. Reviewed the risks of the procedure including the risk of infection, bleeding, damage to surrounding structures, including scrotum, shaft, urethra and head of penis, and an undesired cosmetic effect requiring additional procedures for revision. Consent signed.      [facility] HospitalDYANNA GEFFEL [employer] CompanyCLARK

## 2015-09-05 NOTE — Discharge Summary (Signed)
Obstetric Discharge Summary Reason for Admission: induction of labor Prenatal Procedures: none Intrapartum Procedures: spontaneous vaginal delivery Postpartum Procedures: none Complications-Operative and Postpartum: 2 degree perineal laceration HEMOGLOBIN  Date Value Ref Range Status  09/04/2015 10.7* 12.0 - 15.0 g/dL Final   HCT  Date Value Ref Range Status  09/04/2015 32.8* 36.0 - 46.0 % Final    Physical Exam:  General: alert, cooperative and appears stated age 38Lochia: appropriate Uterine Fundus: firm DVT Evaluation: No evidence of DVT seen on physical exam.  Discharge Diagnoses: Term Pregnancy-delivered  Discharge Information: Date: 09/05/2015 Activity: pelvic rest Diet: routine Medications: PNV and Ibuprofen Condition: stable Instructions: refer to practice specific booklet Discharge to: home Follow-up Information    Follow up with HORVATH,MICHELLE A, MD In 4 weeks.   Specialty:  Obstetrics and Gynecology   Contact information:   [address] [place] [id number] [phone]       Newborn Data: Live born female  Birth Weight: 8 lb 12.4 oz (3980 g) APGAR: 8, 8  Home with mother.  Sandra Saunders Sandra Saunders 09/05/2015, 8:04 AM

## 2015-10-04 ENCOUNTER — Other Ambulatory Visit: Payer: Self-pay | Admitting: Obstetrics and Gynecology

## 2015-10-05 LAB — CYTOLOGY - PAP

## 2016-07-27 ENCOUNTER — Encounter (HOSPITAL_COMMUNITY): Payer: Self-pay | Admitting: Emergency Medicine

## 2016-07-27 DIAGNOSIS — K838 Other specified diseases of biliary tract: Secondary | ICD-10-CM | POA: Insufficient documentation

## 2016-07-27 LAB — URINALYSIS, ROUTINE W REFLEX MICROSCOPIC
Bilirubin Urine: NEGATIVE
GLUCOSE, UA: NEGATIVE mg/dL
Hgb urine dipstick: NEGATIVE
KETONES UR: NEGATIVE mg/dL
NITRITE: NEGATIVE
PROTEIN: NEGATIVE mg/dL
Specific Gravity, Urine: 1.015 (ref 1.005–1.030)
pH: 7 (ref 5.0–8.0)

## 2016-07-27 LAB — URINE MICROSCOPIC-ADD ON

## 2016-07-27 LAB — POC URINE PREG, ED: PREG TEST UR: NEGATIVE

## 2016-07-27 NOTE — ED Triage Notes (Signed)
C/o sharp upper abd pain that radiates to back and bilateral shoulder blades with nausea and vomiting (x3) since 8:30pm tonight.  Last BM yesterday.  Pt has IUD.

## 2016-07-28 ENCOUNTER — Emergency Department (HOSPITAL_COMMUNITY): Payer: BLUE CROSS/BLUE SHIELD

## 2016-07-28 ENCOUNTER — Emergency Department (HOSPITAL_COMMUNITY)
Admission: EM | Admit: 2016-07-28 | Discharge: 2016-07-28 | Disposition: A | Discharge status: Home / Self Care | Payer: BLUE CROSS/BLUE SHIELD | Attending: Emergency Medicine | Admitting: Emergency Medicine

## 2016-07-28 DIAGNOSIS — K8000 Calculus of gallbladder with acute cholecystitis without obstruction: Secondary | ICD-10-CM | POA: Diagnosis not present

## 2016-07-28 DIAGNOSIS — R1110 Vomiting, unspecified: Secondary | ICD-10-CM

## 2016-07-28 DIAGNOSIS — R1011 Right upper quadrant pain: Secondary | ICD-10-CM

## 2016-07-28 DIAGNOSIS — K828 Other specified diseases of gallbladder: Secondary | ICD-10-CM

## 2016-07-28 DIAGNOSIS — R1013 Epigastric pain: Secondary | ICD-10-CM

## 2016-07-28 LAB — COMPREHENSIVE METABOLIC PANEL
ALBUMIN: 4.1 g/dL (ref 3.5–5.0)
ALK PHOS: 97 U/L (ref 38–126)
ALT: 11 U/L — AB (ref 14–54)
ANION GAP: 9 (ref 5–15)
AST: 15 U/L (ref 15–41)
BILIRUBIN TOTAL: 1.1 mg/dL (ref 0.3–1.2)
BUN: 12 mg/dL (ref 6–20)
CALCIUM: 9.5 mg/dL (ref 8.9–10.3)
CO2: 28 mmol/L (ref 22–32)
CREATININE: 0.73 mg/dL (ref 0.44–1.00)
Chloride: 102 mmol/L (ref 101–111)
GFR calc Af Amer: >60 mL/min (ref >60)
GFR calc non Af Amer: >60 mL/min (ref >60)
GLUCOSE: 97 mg/dL (ref 65–99)
Potassium: 4.2 mmol/L (ref 3.5–5.1)
Sodium: 139 mmol/L (ref 135–145)
TOTAL PROTEIN: 7.2 g/dL (ref 6.5–8.1)

## 2016-07-28 LAB — CBC
HEMATOCRIT: 41.5 % (ref 36.0–46.0)
HEMOGLOBIN: 12.9 g/dL (ref 12.0–15.0)
MCH: 27 pg (ref 26.0–34.0)
MCHC: 31.1 g/dL (ref 30.0–36.0)
MCV: 87 fL (ref 78.0–100.0)
Platelets: 196 10*3/uL (ref 150–400)
RBC: 4.77 MIL/uL (ref 3.87–5.11)
RDW: 13.8 % (ref 11.5–15.5)
WBC: 8.8 10*3/uL (ref 4.0–10.5)

## 2016-07-28 LAB — LIPASE, BLOOD: Lipase: 29 U/L (ref 11–51)

## 2016-07-28 MED ORDER — MORPHINE SULFATE (PF) 4 MG/ML IV SOLN
4.0000 mg | Freq: Once | INTRAVENOUS | Status: AC
  Administered 2016-07-28: 4 mg via INTRAVENOUS
  Filled 2016-07-28: qty 1

## 2016-07-28 MED ORDER — HYDROCODONE-ACETAMINOPHEN 5-325 MG PO TABS: 1.0000 | ORAL_TABLET | ORAL | 0 refills | Status: DC | PRN

## 2016-07-28 MED ORDER — SODIUM CHLORIDE 0.9 % IV BOLUS (SEPSIS)
1000.0000 mL | Freq: Once | INTRAVENOUS | Status: AC
  Administered 2016-07-28: 1000 mL via INTRAVENOUS

## 2016-07-28 MED ORDER — ONDANSETRON HCL 4 MG/2ML IJ SOLN
4.0000 mg | Freq: Once | INTRAMUSCULAR | Status: AC
  Administered 2016-07-28: 4 mg via INTRAVENOUS
  Filled 2016-07-28: qty 2

## 2016-07-28 MED ORDER — ONDANSETRON HCL 4 MG PO TABS: 4.0000 mg | ORAL_TABLET | Freq: Four times a day (QID) | ORAL | 0 refills | Status: DC

## 2016-07-28 NOTE — ED Notes (Signed)
Unsuccessful attempts x 2 to start iv

## 2016-07-28 NOTE — ED Notes (Signed)
C/o abd pain sin with nause and vomitingce 2030 tionight

## 2016-07-28 NOTE — ED Notes (Signed)
To us

## 2016-07-28 NOTE — ED Provider Notes (Signed)
[facility]-EMERGENCY DEPT Provider Note   CSN: [id number] Arrival date & time: 07/27/16  2242  History   Chief Complaint Chief Complaint  Patient presents with  . Abdominal Pain    HPI Sandra Saunders is a 25 y.o. female.  HPI   Patient to the ER with complaints of h/o varicella, PONV comes to the ER with complaints of acute onset of RUQ/epigastric abdominal pain an hour after eating pizza. She said this happened once before a week ago. She had multiple episodes of vomiting up the pizza. She still reports having some discomfort but it is not as severe as it was previously.  Denies fever, CP, SOB, diarrhea, low back pain, dysuria, vaginal bleeding or discharge.  Past Medical History:  Diagnosis Date  . H/O varicella   . No pertinent past medical history   . PONV (postoperative nausea and vomiting)     Patient Active Problem List   Diagnosis Date Noted  . Postpartum state 09/04/2015  . Pregnancy 09/03/2015    Past Surgical History:  Procedure Laterality Date  . ANTERIOR CRUCIATE LIGAMENT REPAIR     left knee  . WISDOM TOOTH EXTRACTION      OB History    Gravida Para Term Preterm AB Living   2 2 2  0 0 2   SAB TAB Ectopic Multiple Live Births   0 0 0 0 2       Home Medications    Prior to Admission medications   Medication Sig Start Date End Date Taking? Authorizing Provider  levonorgestrel (MIRENA) 20 MCG/24HR IUD 1 each by Intrauterine route once.   Yes Historical Provider, MD  HYDROcodone-acetaminophen (NORCO/VICODIN) 5-325 MG tablet Take 1-2 tablets by mouth every 4 (four) hours as needed. 07/28/16   Abagael Kramm Neva Seat, PA-C  ibuprofen (ADVIL,MOTRIN) 800 MG tablet Take 1 tablet (800 mg total) by mouth 3 (three) times daily. Patient not taking: Reported on 07/28/2016 09/05/15   Marlow Baars, MD  ondansetron (ZOFRAN) 4 MG tablet Take 1 tablet (4 mg total) by mouth every 6 (six) hours. 07/28/16   Marlon Pel, PA-C    Family History Family History  Problem Relation Age  of Onset  . Birth defects Other     omphalocele  . Cancer Mother     melanoma  . Tuberculosis Paternal Grandmother   . Cancer Paternal Grandmother     colon  . Diabetes Paternal Grandfather     Social History Social History  Substance Use Topics  . Smoking status: Never Smoker  . Smokeless tobacco: Never Used  . Alcohol use No     Allergies   Cephalexin   Review of Systems Review of Systems  Review of Systems All other systems negative except as documented in the HPI. All pertinent positives and negatives as reviewed in the HPI.  Physical Exam Updated Vital Signs BP 117/78   Pulse 71   Temp 97.9 F (36.6 C) (Oral)   Resp 13   Ht 5\' 5"  (1.651 m)   Wt 104.3 kg   LMP 07/13/2016 (Approximate)   SpO2 98%   BMI 38.27 kg/m   Physical Exam  Constitutional: She appears well-developed and well-nourished.  HENT:  Head: Normocephalic and atraumatic.  Eyes: Conjunctivae are normal. Pupils are equal, round, and reactive to light.  Neck: Trachea normal, normal range of motion and full passive range of motion without pain. Neck supple.  Cardiovascular: Normal rate, regular rhythm and normal pulses.   Pulmonary/Chest: Effort normal and breath sounds normal. Chest  wall is not dull to percussion. She exhibits no tenderness, no crepitus, no edema, no deformity and no retraction.  Abdominal: Soft. Normal appearance and bowel sounds are normal. There is tenderness in the right upper quadrant and epigastric area. There is no rigidity, no rebound, no guarding, no tenderness at McBurney's point and negative Murphy's sign.  Musculoskeletal: Normal range of motion.  Neurological: She is alert. She has normal strength.  Skin: Skin is warm, dry and intact.  Psychiatric: She has a normal mood and affect. Her speech is normal and behavior is normal. Judgment and thought content normal. Cognition and memory are normal.     ED Treatments / Results  Labs (all labs ordered are listed, but  only abnormal results are displayed) Labs Reviewed  COMPREHENSIVE METABOLIC PANEL - Abnormal; Notable for the following:       Result Value   ALT 11 (*)    All other components within normal limits  URINALYSIS, ROUTINE W REFLEX MICROSCOPIC (NOT AT [facility]) - Abnormal; Notable for the following:    Leukocytes, UA TRACE (*)    All other components within normal limits  URINE MICROSCOPIC-ADD ON - Abnormal; Notable for the following:    Squamous Epithelial / LPF 0-5 (*)    Bacteria, UA RARE (*)    All other components within normal limits  LIPASE, BLOOD  CBC  POC URINE PREG, ED    EKG  EKG Interpretation None       Radiology Kore[place] Abdomen Limited Ruq  Result Date: 07/28/2016 CLINICAL DATA:  Right upper quadrant pain and vomiting EXAM: US ABDOMEN LIMITED - RIGHT UPPER QUADRANT COMPARISON:  None. FINDINGS: Gallbladder: There is a small amount of sludge within the gallbladder. No positive sonographic Eulah PontMurphy sign was demonstrated by the sonographer. There is no gallbladder wall thickening or pericholecystic fluid. Common bile duct: Diameter: 4.7 mm, within normal limits Liver: No focal liver lesion is identified. The hepatic echotexture is echogenic. IMPRESSION: 1. Gallbladder sludge without other evidence of acute cholecystitis. 2. Suspected mild hepatic steatosis.  The Electronically Signed   By: Deatra RobinsonKevin  Herman M.D.   On: 07/28/2016 03:18    Procedures Procedures (including critical care time)  Medications Ordered in ED Medications  morphine 4 MG/ML injection 4 mg (4 mg Intravenous Given 07/28/16 0208)  ondansetron (ZOFRAN) injection 4 mg (4 mg Intravenous Given 07/28/16 0206)  sodium chloride 0.9 % bolus 1,000 mL (1,000 mLs Intravenous New Bag/Given 07/28/16 0204)    Initial Impression / Assessment and Plan / ED Course  I have reviewed the triage vital signs and the nursing notes.  Pertinent labs & imaging results that were available during my care of the patient were reviewed by me and  considered in my medical decision making (see chart for details).  Clinical Course    Patient is nontoxic, nonseptic appearing, in no apparent distress.  Patient's pain and other symptoms adequately managed in emergency department.  Fluid bolus given.  Labs, imaging and vitals reviewed.  Patient does not meet the SIRS or Sepsis criteria.  On repeat exam patient does not have a surgical abdomin and there are no peritoneal signs.  No indication of appendicitis, bowel obstruction, bowel perforation, cholecystitis, diverticulitis, ectopic pregnancy.   Patient does have sludge within the gallbaldder, labwork all normal. Pain easily controlled in the ED and she passed fluid challenge without any difficulty. Will give guidelines for low fat diet and [facility]. Discussed return to ED precautions.  Patient discharged home with symptomatic treatment and given strict instructions for follow-up with their primary care physician.  I have also discussed reasons to return immediately to the ER.  Patient expresses understanding and agrees with plan.   Final Clinical Impressions(s) / ED Diagnoses   Final diagnoses:  Vomiting  Epigastric pain  RUQ pain  Gallbladder sludge    New Prescriptions New Prescriptions   HYDROCODONE-ACETAMINOPHEN (NORCO/VICODIN) 5-325 MG TABLET    Take 1-2 tablets by mouth every 4 (four) hours as needed.   ONDANSETRON (ZOFRAN) 4 MG TABLET    Take 1 tablet (4 mg total) by mouth every 6 (six) hours.     Marlon Pel, PA-C 07/28/16 1610    Shon Baton, MD 07/28/16 [phone]

## 2016-07-29 ENCOUNTER — Emergency Department (HOSPITAL_COMMUNITY): Payer: BLUE CROSS/BLUE SHIELD

## 2016-07-29 ENCOUNTER — Inpatient Hospital Stay (HOSPITAL_COMMUNITY)
Admission: EM | Admit: 2016-07-29 | Discharge: 2016-07-31 | DRG: 419 | Disposition: A | Discharge status: Home / Self Care | Payer: BLUE CROSS/BLUE SHIELD | Attending: General Surgery | Admitting: General Surgery

## 2016-07-29 ENCOUNTER — Encounter (HOSPITAL_COMMUNITY): Payer: Self-pay | Admitting: Emergency Medicine

## 2016-07-29 DIAGNOSIS — Z975 Presence of (intrauterine) contraceptive device: Secondary | ICD-10-CM

## 2016-07-29 DIAGNOSIS — K8000 Calculus of gallbladder with acute cholecystitis without obstruction: Secondary | ICD-10-CM | POA: Diagnosis present

## 2016-07-29 DIAGNOSIS — R1011 Right upper quadrant pain: Secondary | ICD-10-CM

## 2016-07-29 DIAGNOSIS — R110 Nausea: Secondary | ICD-10-CM

## 2016-07-29 DIAGNOSIS — Z881 Allergy status to other antibiotic agents status: Secondary | ICD-10-CM

## 2016-07-29 DIAGNOSIS — Z6838 Body mass index (BMI) 38.0-38.9, adult: Secondary | ICD-10-CM

## 2016-07-29 DIAGNOSIS — K819 Cholecystitis, unspecified: Secondary | ICD-10-CM

## 2016-07-29 DIAGNOSIS — E669 Obesity, unspecified: Secondary | ICD-10-CM | POA: Diagnosis present

## 2016-07-29 LAB — URINALYSIS, ROUTINE W REFLEX MICROSCOPIC
Bilirubin Urine: NEGATIVE
GLUCOSE, UA: NEGATIVE mg/dL
HGB URINE DIPSTICK: NEGATIVE
KETONES UR: NEGATIVE mg/dL
Nitrite: NEGATIVE
PROTEIN: NEGATIVE mg/dL
Specific Gravity, Urine: 1.011 (ref 1.005–1.030)
pH: 6 (ref 5.0–8.0)

## 2016-07-29 LAB — CBC
HCT: 42 % (ref 36.0–46.0)
Hemoglobin: 13.6 g/dL (ref 12.0–15.0)
MCH: 27.7 pg (ref 26.0–34.0)
MCHC: 32.4 g/dL (ref 30.0–36.0)
MCV: 85.5 fL (ref 78.0–100.0)
PLATELETS: 208 10*3/uL (ref 150–400)
RBC: 4.91 MIL/uL (ref 3.87–5.11)
RDW: 13.8 % (ref 11.5–15.5)
WBC: 10.1 10*3/uL (ref 4.0–10.5)

## 2016-07-29 LAB — COMPREHENSIVE METABOLIC PANEL
ALK PHOS: 100 U/L (ref 38–126)
ALT: 12 U/L — AB (ref 14–54)
AST: 15 U/L (ref 15–41)
Albumin: 4.3 g/dL (ref 3.5–5.0)
Anion gap: 9 (ref 5–15)
BUN: 10 mg/dL (ref 6–20)
CHLORIDE: 104 mmol/L (ref 101–111)
CO2: 26 mmol/L (ref 22–32)
CREATININE: 0.74 mg/dL (ref 0.44–1.00)
Calcium: 10 mg/dL (ref 8.9–10.3)
GFR calc Af Amer: >60 mL/min (ref >60)
Glucose, Bld: 90 mg/dL (ref 65–99)
Potassium: 4.6 mmol/L (ref 3.5–5.1)
Sodium: 139 mmol/L (ref 135–145)
Total Bilirubin: 1.1 mg/dL (ref 0.3–1.2)
Total Protein: 7.9 g/dL (ref 6.5–8.1)

## 2016-07-29 LAB — URINE MICROSCOPIC-ADD ON: RBC / HPF: NONE SEEN RBC/hpf (ref 0–5)

## 2016-07-29 LAB — I-STAT BETA HCG BLOOD, ED (MC, WL, AP ONLY): I-stat hCG, quantitative: <5 m[IU]/mL (ref <5)

## 2016-07-29 LAB — LIPASE, BLOOD: LIPASE: 22 U/L (ref 11–51)

## 2016-07-29 MED ORDER — TRAMADOL HCL 50 MG PO TABS: 50.0000 mg | ORAL_TABLET | Freq: Four times a day (QID) | ORAL | 0 refills | Status: DC | PRN

## 2016-07-29 MED ORDER — HYDROMORPHONE HCL 1 MG/ML IJ SOLN
0.5000 mg | Freq: Once | INTRAMUSCULAR | Status: AC
  Administered 2016-07-29: 0.5 mg via INTRAVENOUS
  Filled 2016-07-29: qty 1

## 2016-07-29 MED ORDER — OXYCODONE-ACETAMINOPHEN 5-325 MG PO TABS
ORAL_TABLET | ORAL | Status: AC
  Filled 2016-07-29: qty 1

## 2016-07-29 MED ORDER — OXYCODONE-ACETAMINOPHEN 5-325 MG PO TABS
1.0000 | ORAL_TABLET | ORAL | Status: DC | PRN
  Administered 2016-07-29: 1 via ORAL

## 2016-07-29 MED ORDER — ONDANSETRON 4 MG PO TBDP
4.0000 mg | ORAL_TABLET | Freq: Once | ORAL | Status: AC | PRN
  Administered 2016-07-29: 4 mg via ORAL

## 2016-07-29 MED ORDER — ONDANSETRON 4 MG PO TBDP
ORAL_TABLET | ORAL | Status: AC
  Filled 2016-07-29: qty 1

## 2016-07-29 MED ORDER — PROMETHAZINE HCL 25 MG/ML IJ SOLN
25.0000 mg | Freq: Once | INTRAMUSCULAR | Status: AC
  Administered 2016-07-29: 25 mg via INTRAVENOUS
  Filled 2016-07-29: qty 1

## 2016-07-29 NOTE — ED Triage Notes (Signed)
PT  Tearful and rocking with arms wrapped around ABD.  Family at bed side with PT . PA informed

## 2016-07-29 NOTE — H&P (Signed)
Sandra Saunders is an 25 y.o. female.   Chief Complaint: Abdominal pain  HPI:  Patient is a 25 year old female who came to the emergency department yesterday and today for right upper quadrant/epigastric pain. She was prescribed Norco and Zofran yesterday when her right upper quadrant ultrasound demonstrated sludge in her gallbladder. She had normal liver function tests.  She had recurrent pain today when she had a hot dog for lunch. She did not take the Norco because she was afraid it would make her sleepy and she has young children at home. She has also had problems with nausea. Because she was unable to be pain free, the emergency department repeated her right upper quadrant ultrasound. This ultrasound demonstrated a 1.7 cm stone lodged in the neck of the gallbladder.  Past Medical History:  Diagnosis Date  . H/O varicella   . No pertinent past medical history   . PONV (postoperative nausea and vomiting)     Past Surgical History:  Procedure Laterality Date  . ANTERIOR CRUCIATE LIGAMENT REPAIR     left knee  . WISDOM TOOTH EXTRACTION      Family History  Problem Relation Age of Onset  . Birth defects Other     omphalocele  . Cancer Mother     melanoma  . Tuberculosis Paternal Grandmother   . Cancer Paternal Grandmother     colon  . Diabetes Paternal Grandfather   sister had gallbladder disease.  Social History:  reports that she has never smoked. She has never used smokeless tobacco. She reports that she does not drink alcohol or use drugs.  Allergies:  Allergies  Allergen Reactions  . Cephalexin Rash   Medications: Mirena Advil Tramadol (norco/zofran)  Results for orders placed or performed during the hospital encounter of 07/29/16 (from the past 48 hour(s))  Lipase, blood     Status: None   Collection Time: 07/29/16  5:32 PM  Result Value Ref Range   Lipase 22 11 - 51 U/L  Comprehensive metabolic panel     Status: Abnormal   Collection Time: 07/29/16  5:32 PM   Result Value Ref Range   Sodium 139 135 - 145 mmol/L   Potassium 4.6 3.5 - 5.1 mmol/L   Chloride 104 101 - 111 mmol/L   CO2 26 22 - 32 mmol/L   Glucose, Bld 90 65 - 99 mg/dL   BUN 10 6 - 20 mg/dL   Creatinine, Ser 0.74 0.44 - 1.00 mg/dL   Calcium 10.0 8.9 - 10.3 mg/dL   Total Protein 7.9 6.5 - 8.1 g/dL   Albumin 4.3 3.5 - 5.0 g/dL   AST 15 15 - 41 U/L   ALT 12 (L) 14 - 54 U/L   Alkaline Phosphatase 100 38 - 126 U/L   Total Bilirubin 1.1 0.3 - 1.2 mg/dL   GFR calc non Af Amer >60 >60 mL/min   GFR calc Af Amer >60 >60 mL/min    Comment: (NOTE) The eGFR has been calculated using the CKD EPI equation. This calculation has not been validated in all clinical situations. eGFR's persistently <60 mL/min signify possible Chronic Kidney Disease.    Anion gap 9 5 - 15  CBC     Status: None   Collection Time: 07/29/16  5:32 PM  Result Value Ref Range   WBC 10.1 4.0 - 10.5 K/uL   RBC 4.91 3.87 - 5.11 MIL/uL   Hemoglobin 13.6 12.0 - 15.0 g/dL   HCT 42.0 36.0 - 46.0 %  MCV 85.5 78.0 - 100.0 fL   MCH 27.7 26.0 - 34.0 pg   MCHC 32.4 30.0 - 36.0 g/dL   RDW 13.8 11.5 - 15.5 %   Platelets 208 150 - 400 K/uL  Urinalysis, Routine w reflex microscopic     Status: Abnormal   Collection Time: 07/29/16  5:42 PM  Result Value Ref Range   Color, Urine YELLOW YELLOW   APPearance HAZY (A) CLEAR   Specific Gravity, Urine 1.011 1.005 - 1.030   pH 6.0 5.0 - 8.0   Glucose, UA NEGATIVE NEGATIVE mg/dL   Hgb urine dipstick NEGATIVE NEGATIVE   Bilirubin Urine NEGATIVE NEGATIVE   Ketones, ur NEGATIVE NEGATIVE mg/dL   Protein, ur NEGATIVE NEGATIVE mg/dL   Nitrite NEGATIVE NEGATIVE   Leukocytes, UA SMALL (A) NEGATIVE  Urine microscopic-add on     Status: Abnormal   Collection Time: 07/29/16  5:42 PM  Result Value Ref Range   Squamous Epithelial / LPF 6-30 (A) NONE SEEN   WBC, UA 6-30 0 - 5 WBC/hpf   RBC / HPF NONE SEEN 0 - 5 RBC/hpf   Bacteria, UA FEW (A) NONE SEEN  I-Stat beta hCG blood, ED      Status: None   Collection Time: 07/29/16  6:38 PM  Result Value Ref Range   I-stat hCG, quantitative <5.0 <5 mIU/mL   Comment 3            Comment:   GEST. AGE      CONC.  (mIU/mL)   <=1 WEEK        5 - 50     2 WEEKS       50 - 500     3 WEEKS       100 - 10,000     4 WEEKS     1,000 - 30,000        FEMALE AND NON-PREGNANT FEMALE:     LESS THAN 5 mIU/mL    US Abdomen Limited Ruq  Result Date: 07/29/2016 CLINICAL DATA:  Acute onset of right upper quadrant abdominal pain. Initial encounter. EXAM: US ABDOMEN LIMITED - RIGHT UPPER QUADRANT COMPARISON:  Right upper quadrant abdominal ultrasound performed 07/28/2016 FINDINGS: Gallbladder: A large 1.7 cm stone is lodged at the gallbladder neck. Mild underlying sludge is noted within the gallbladder. No pericholecystic fluid or gallbladder wall thickening is seen. No ultrasonographic Murphy's sign is elicited. Common bile duct: Diameter: 0.3 cm, within normal limits in caliber. Liver: No focal lesion identified. Within normal limits in parenchymal echogenicity. IMPRESSION: Large stone noted lodged at the gallbladder neck. Mild underlying sludge within the gallbladder. No definite evidence for obstruction or cholecystitis. Electronically Signed   By: Garald Balding M.D.   On: 07/29/2016 22:24   US Abdomen Limited Ruq  Result Date: 07/28/2016 CLINICAL DATA:  Right upper quadrant pain and vomiting EXAM: US ABDOMEN LIMITED - RIGHT UPPER QUADRANT COMPARISON:  None. FINDINGS: Gallbladder: There is a small amount of sludge within the gallbladder. No positive sonographic Percell Miller sign was demonstrated by the sonographer. There is no gallbladder wall thickening or pericholecystic fluid. Common bile duct: Diameter: 4.7 mm, within normal limits Liver: No focal liver lesion is identified. The hepatic echotexture is echogenic. IMPRESSION: 1. Gallbladder sludge without other evidence of acute cholecystitis. 2. Suspected mild hepatic steatosis.  The Electronically  Signed   By: Ulyses Jarred M.D.   On: 07/28/2016 03:18    Review of Systems  Constitutional:       Sensation  of hot episodes  HENT: Negative.   Eyes: Negative.   Respiratory: Negative.   Cardiovascular: Negative.   Gastrointestinal: Positive for abdominal pain, nausea and vomiting.  Genitourinary: Negative.   Musculoskeletal: Negative.   Skin: Negative.   Neurological: Negative.   Endo/Heme/Allergies: Negative.   Psychiatric/Behavioral: Negative.     Blood pressure 108/66, pulse 85, temperature 98.1 F (36.7 C), temperature source Oral, resp. rate 16, height 5' 5"  (1.651 m), weight 104.3 kg (230 lb), last menstrual period 07/13/2016, SpO2 96 %, unknown if currently breastfeeding. Physical Exam  Constitutional: She is oriented to person, place, and time. She appears well-developed and well-nourished. She appears distressed (looks uncomfortable).  HENT:  Head: Normocephalic and atraumatic.  Eyes: Conjunctivae are normal. Pupils are equal, round, and reactive to light. Right eye exhibits no discharge. Left eye exhibits no discharge. No scleral icterus.  Neck: Normal range of motion. Neck supple.  Cardiovascular: Normal rate, regular rhythm and intact distal pulses.   Respiratory: Effort normal. No respiratory distress.  GI: Soft. She exhibits no distension. There is tenderness (mild RUQ tenderness).  Musculoskeletal: She exhibits no deformity.  Neurological: She is alert and oriented to person, place, and time.  Skin: Skin is warm and dry. No rash noted. She is not diaphoretic. No erythema. No pallor.  Psychiatric: She has a normal mood and affect. Her behavior is normal. Judgment and thought content normal.     Assessment/Plan Acute calculous cholecystitis. Although the patient has a normal white count, and no classic ultrasound findings for cholecystitis, the fact that she is unable to be pain-free and has a stone lodged in the gallbladder neck indicates imminent development of  cholecystitis. She is also tender. We'll admit her and place her on IV antibiotics. She'll be NPO for planned cholecystectomy. I reviewed surgery and risks with the patient.    Stark Klein, MD 07/29/2016, 11:06 PM

## 2016-07-29 NOTE — ED Triage Notes (Signed)
Pt presents to ED for assessment of right upper quadrant pain radiating to back between shoulder blades.  Pt seen here two days ago for "gallbladder attacks" and told she had "sludge".  Pt sts pain started approx 1530.  Pt nauseous at this time, denies vomiting.  Denies diarrhea.  Pt has appt on 10/20 to see surgeon for consultation.  Pt given Vicodin yesterday, sts she has not taken today for the pain.

## 2016-07-29 NOTE — ED Provider Notes (Signed)
[facility]-EM[facility]ENCY DEPT Provider Note   CSN: [id number] Arrival date & time: 07/29/16  1724     History   Chief Complaint Chief Complaint  Patient presents with  . Abdominal Pain    HPI Sandra Saunders is a 25 y.o. female who presents with RUQ pain. PMH of PONV and RUQ pain. She was seen early Monday morning and seen for the same. Diagnosed with "gallbladder sludge". Given Norco and Zofran. The pain came back acutely today after she ate a hot dog for lunch.  It is in the RUQ and radiates to the back. It is more severe than when she last presented on Monday. Patient states she has taken the Zofran however not taken the Norco because she has young children at home. She made an appointment with [facility] however this is not until 10/20 and they told her surgery would not be able to performed until November. She denies fever, chills, V/D, dysuria, vaginal complaints.  HPI  Past Medical History:  Diagnosis Date  . H/O varicella   . No pertinent past medical history   . PONV (postoperative nausea and vomiting)     Patient Active Problem List   Diagnosis Date Noted  . Postpartum state 09/04/2015  . Pregnancy 09/03/2015    Past Surgical History:  Procedure Laterality Date  . ANTERIOR CRUCIATE LIGAMENT REPAIR     left knee  . WISDOM TOOTH EXTRACTION      OB History    Gravida Para Term Preterm AB Living   2 2 2  0 0 2   SAB TAB Ectopic Multiple Live Births   0 0 0 0 2       Home Medications    Prior to Admission medications   Medication Sig Start Date End Date Taking? Authorizing Provider  HYDROcodone-acetaminophen (NORCO/VICODIN) 5-325 MG tablet Take 1-2 tablets by mouth every 4 (four) hours as needed. 07/28/16   Tiffany Neva Seat, PA-C  ibuprofen (ADVIL,MOTRIN) 800 MG tablet Take 1 tablet (800 mg total) by mouth 3 (three) times daily. Patient not taking: Reported on 07/28/2016 09/05/15   Marlow Baars, MD  levonorgestrel (MIRENA) 20 MCG/24HR IUD 1 each by  Intrauterine route once.    Historical Provider, MD  ondansetron (ZOFRAN) 4 MG tablet Take 1 tablet (4 mg total) by mouth every 6 (six) hours. 07/28/16   Marlon Pel, PA-C    Family History Family History  Problem Relation Age of Onset  . Birth defects Other     omphalocele  . Cancer Mother     melanoma  . Tuberculosis Paternal Grandmother   . Cancer Paternal Grandmother     colon  . Diabetes Paternal Grandfather     Social History Social History  Substance Use Topics  . Smoking status: Never Smoker  . Smokeless tobacco: Never Used  . Alcohol use No     Allergies   Cephalexin   Review of Systems Review of Systems  Constitutional: Negative for chills and fever.  Gastrointestinal: Positive for abdominal pain and nausea. Negative for diarrhea and vomiting.  Genitourinary: Negative for dysuria, vaginal bleeding and vaginal discharge.     Physical Exam Updated Vital Signs BP (!) 151/125 (BP Location: Left Arm)   Pulse 105   Temp 98.1 F (36.7 C) (Oral)   Resp 18   Ht 5\' 5"  (1.651 m)   Wt 104.3 kg   LMP 07/13/2016 (Approximate)   SpO2 97%   BMI 38.27 kg/m   Physical Exam  Constitutional: She is oriented  to person, place, and time. She appears well-developed and well-nourished. No distress.  Obese, tearful, holding abdomen  HENT:  Head: Normocephalic and atraumatic.  Eyes: Conjunctivae are normal. Pupils are equal, round, and reactive to light. Right eye exhibits no discharge. Left eye exhibits no discharge. No scleral icterus.  Neck: Normal range of motion. Neck supple.  Cardiovascular: Normal rate and regular rhythm.  Exam reveals no gallop and no friction rub.   No murmur heard. Pulmonary/Chest: Effort normal and breath sounds normal. No respiratory distress. She has no wheezes. She has no rales. She exhibits no tenderness.  Abdominal: Soft. Bowel sounds are normal. She exhibits no distension and no mass. There is tenderness. There is no rebound and no  guarding. No hernia.  RUQ tenderness  Musculoskeletal: She exhibits no edema.  Neurological: She is alert and oriented to person, place, and time.  Skin: Skin is warm and dry.  Psychiatric: She has a normal mood and affect. Her behavior is normal.  Nursing note and vitals reviewed.    ED Treatments / Results  Labs (all labs ordered are listed, but only abnormal results are displayed) Labs Reviewed  COMPREHENSIVE METABOLIC PANEL - Abnormal; Notable for the following:       Result Value   ALT 12 (*)    All other components within normal limits  URINALYSIS, ROUTINE W REFLEX MICROSCOPIC (NOT AT [facility]) - Abnormal; Notable for the following:    APPearance HAZY (*)    Leukocytes, UA SMALL (*)    All other components within normal limits  URINE MICROSCOPIC-ADD ON - Abnormal; Notable for the following:    Squamous Epithelial / LPF 6-30 (*)    Bacteria, UA FEW (*)    All other components within normal limits  LIPASE, BLOOD  CBC  I-STAT BETA HCG BLOOD, ED ([facility], [facility], [facility] ONLY)    EKG  EKG Interpretation None       Radiology US Abdomen Limited Ruq  Result Date: 07/28/2016 CLINICAL DATA:  Right upper quadrant pain and vomiting EXAM: US ABDOMEN LIMITED - RIGHT UPPER QUADRANT COMPARISON:  None. FINDINGS: Gallbladder: There is a small amount of sludge within the gallbladder. No positive sonographic Eulah Pont sign was demonstrated by the sonographer. There is no gallbladder wall thickening or pericholecystic fluid. Common bile duct: Diameter: 4.7 mm, within normal limits Liver: No focal liver lesion is identified. The hepatic echotexture is echogenic. IMPRESSION: 1. Gallbladder sludge without other evidence of acute cholecystitis. 2. Suspected mild hepatic steatosis.  The Electronically Signed   By: Deatra Robinson M.D.   On: 07/28/2016 03:18    Procedures Procedures (including critical care time)  Medications Ordered in ED Medications  ondansetron (ZOFRAN-ODT) disintegrating tablet 4 mg (4 mg  Oral Given 07/29/16 1736)  HYDROmorphone (DILAUDID) injection 0.5 mg (0.5 mg Intravenous Given 07/29/16 1928)  promethazine (PHENERGAN) injection 25 mg (25 mg Intravenous Given 07/29/16 1928)     Initial Impression / Assessment and Plan / ED Course  I have reviewed the triage vital signs and the nursing notes.  Pertinent labs & imaging results that were available during my care of the patient were reviewed by me and considered in my medical decision making (see chart for details).  Clinical Course   25 year old female presents with persistent RUQ pain. Patient is afebrile. Initially tachycardic and hypertensive which is improved after pain medicine. Repeat labs are unremarkable. UA clean. Preg test neg. Patient has not been taking pain meds due to concern for taking them while  around her children and while at work. Consulted Dr. Donell BeersByerly with surgery who recommended a HIDA scan or repeat RUQ US but states she wouldn't be able to admit and do surgery based on current lab results and pain.   Spoke with radiology who also reported they would not be able to do HIDA scan due to administration of narcotic pain medicine. She needs to be without these meds for 7 hours. Shared medical decision making: Discussed admission for intractable pain with the possibility of doing the HIDA scan in the hospital vs RUQ US vs outpatient management, change of pain meds, and change of diet. Patient and husband live an hour away and would like to repeat US since pain has gotten worse. If negative they will attempt to manage as outpatient - declined admission.   RUQ shows 1.7cm stone in neck. Re-paged Dr. Donell BeersByerly who will admit patient.  Final Clinical Impressions(s) / ED Diagnoses   Final diagnoses:  RUQ pain  Nausea    New Prescriptions New Prescriptions   No medications on file     Bethel BornKelly Marie Ronon Ferger, PA-C 07/29/16 2305    Mancel BaleElliott Wentz, MD 07/30/16 0003

## 2016-07-30 ENCOUNTER — Inpatient Hospital Stay (HOSPITAL_COMMUNITY): Payer: BLUE CROSS/BLUE SHIELD | Admitting: Certified Registered"

## 2016-07-30 ENCOUNTER — Encounter (HOSPITAL_COMMUNITY): Payer: Self-pay | Admitting: Anesthesiology

## 2016-07-30 ENCOUNTER — Inpatient Hospital Stay (HOSPITAL_COMMUNITY): Payer: BLUE CROSS/BLUE SHIELD

## 2016-07-30 ENCOUNTER — Encounter (HOSPITAL_COMMUNITY): Admission: EM | Disposition: A | Discharge status: Home / Self Care | Payer: Self-pay | Source: Home / Self Care

## 2016-07-30 DIAGNOSIS — Z6838 Body mass index (BMI) 38.0-38.9, adult: Secondary | ICD-10-CM | POA: Diagnosis not present

## 2016-07-30 DIAGNOSIS — K8000 Calculus of gallbladder with acute cholecystitis without obstruction: Secondary | ICD-10-CM | POA: Diagnosis present

## 2016-07-30 DIAGNOSIS — E669 Obesity, unspecified: Secondary | ICD-10-CM | POA: Diagnosis present

## 2016-07-30 DIAGNOSIS — Z881 Allergy status to other antibiotic agents status: Secondary | ICD-10-CM | POA: Diagnosis not present

## 2016-07-30 DIAGNOSIS — R1013 Epigastric pain: Secondary | ICD-10-CM | POA: Diagnosis present

## 2016-07-30 DIAGNOSIS — Z975 Presence of (intrauterine) contraceptive device: Secondary | ICD-10-CM | POA: Diagnosis not present

## 2016-07-30 HISTORY — PX: CHOLECYSTECTOMY: SHX55

## 2016-07-30 LAB — CBC
HEMATOCRIT: 38.8 % (ref 36.0–46.0)
HEMOGLOBIN: 12.1 g/dL (ref 12.0–15.0)
MCH: 26.7 pg (ref 26.0–34.0)
MCHC: 31.2 g/dL (ref 30.0–36.0)
MCV: 85.7 fL (ref 78.0–100.0)
Platelets: 165 10*3/uL (ref 150–400)
RBC: 4.53 MIL/uL (ref 3.87–5.11)
RDW: 14 % (ref 11.5–15.5)
WBC: 8.1 10*3/uL (ref 4.0–10.5)

## 2016-07-30 LAB — COMPREHENSIVE METABOLIC PANEL
ALBUMIN: 3.4 g/dL — AB (ref 3.5–5.0)
ALT: 10 U/L — ABNORMAL LOW (ref 14–54)
ANION GAP: 8 (ref 5–15)
AST: 14 U/L — ABNORMAL LOW (ref 15–41)
Alkaline Phosphatase: 82 U/L (ref 38–126)
BUN: 11 mg/dL (ref 6–20)
CALCIUM: 9.2 mg/dL (ref 8.9–10.3)
CHLORIDE: 105 mmol/L (ref 101–111)
CO2: 27 mmol/L (ref 22–32)
Creatinine, Ser: 0.83 mg/dL (ref 0.44–1.00)
GFR calc non Af Amer: >60 mL/min (ref >60)
GLUCOSE: 100 mg/dL — AB (ref 65–99)
POTASSIUM: 4.2 mmol/L (ref 3.5–5.1)
SODIUM: 140 mmol/L (ref 135–145)
Total Bilirubin: 1.1 mg/dL (ref 0.3–1.2)
Total Protein: 6.3 g/dL — ABNORMAL LOW (ref 6.5–8.1)

## 2016-07-30 LAB — SURGICAL PCR SCREEN
MRSA, PCR: NEGATIVE
Staphylococcus aureus: NEGATIVE

## 2016-07-30 SURGERY — LAPAROSCOPIC CHOLECYSTECTOMY WITH INTRAOPERATIVE CHOLANGIOGRAM
Anesthesia: General | Site: Abdomen

## 2016-07-30 MED ORDER — ACETAMINOPHEN 325 MG PO TABS: 650.0000 mg | ORAL_TABLET | Freq: Four times a day (QID) | ORAL | Status: DC | PRN

## 2016-07-30 MED ORDER — SODIUM CHLORIDE 0.9 % IR SOLN
Status: DC | PRN
Start: 2016-07-30 — End: 2016-07-30
  Administered 2016-07-30: 1000 mL

## 2016-07-30 MED ORDER — PROPOFOL 10 MG/ML IV BOLUS
INTRAVENOUS | Status: AC
  Filled 2016-07-30: qty 20

## 2016-07-30 MED ORDER — SENNA 8.6 MG PO TABS
1.0000 | ORAL_TABLET | Freq: Two times a day (BID) | ORAL | Status: DC
  Filled 2016-07-30: qty 1

## 2016-07-30 MED ORDER — DEXAMETHASONE SODIUM PHOSPHATE 10 MG/ML IJ SOLN
INTRAMUSCULAR | Status: DC | PRN
  Administered 2016-07-30: 10 mg via INTRAVENOUS

## 2016-07-30 MED ORDER — ROCURONIUM BROMIDE 100 MG/10ML IV SOLN
INTRAVENOUS | Status: DC | PRN
  Administered 2016-07-30: 50 mg via INTRAVENOUS

## 2016-07-30 MED ORDER — LABETALOL HCL 5 MG/ML IV SOLN
INTRAVENOUS | Status: AC
  Filled 2016-07-30: qty 4

## 2016-07-30 MED ORDER — FENTANYL CITRATE (PF) 100 MCG/2ML IJ SOLN
INTRAMUSCULAR | Status: AC
  Filled 2016-07-30: qty 2

## 2016-07-30 MED ORDER — ROCURONIUM BROMIDE 10 MG/ML (PF) SYRINGE
PREFILLED_SYRINGE | INTRAVENOUS | Status: AC
  Filled 2016-07-30: qty 10

## 2016-07-30 MED ORDER — FENTANYL CITRATE (PF) 100 MCG/2ML IJ SOLN
INTRAMUSCULAR | Status: DC | PRN
  Administered 2016-07-30 (×5): 50 ug via INTRAVENOUS
  Administered 2016-07-30: 100 ug via INTRAVENOUS

## 2016-07-30 MED ORDER — CIPROFLOXACIN IN D5W 400 MG/200ML IV SOLN
400.0000 mg | Freq: Two times a day (BID) | INTRAVENOUS | Status: DC
  Administered 2016-07-30 (×2): 400 mg via INTRAVENOUS
  Filled 2016-07-30 (×2): qty 200

## 2016-07-30 MED ORDER — OXYCODONE HCL 5 MG PO TABS
5.0000 mg | ORAL_TABLET | ORAL | Status: DC | PRN
  Administered 2016-07-30 – 2016-07-31 (×2): 10 mg via ORAL
  Filled 2016-07-30 (×2): qty 2

## 2016-07-30 MED ORDER — HYDROMORPHONE HCL 1 MG/ML IJ SOLN: 0.2500 mg | INTRAMUSCULAR | Status: DC | PRN

## 2016-07-30 MED ORDER — METHOCARBAMOL 500 MG PO TABS: 500.0000 mg | ORAL_TABLET | Freq: Four times a day (QID) | ORAL | Status: DC | PRN

## 2016-07-30 MED ORDER — ONDANSETRON 4 MG PO TBDP: 4.0000 mg | ORAL_TABLET | Freq: Four times a day (QID) | ORAL | Status: DC | PRN

## 2016-07-30 MED ORDER — KETOROLAC TROMETHAMINE 30 MG/ML IJ SOLN
30.0000 mg | Freq: Three times a day (TID) | INTRAMUSCULAR | Status: DC
  Administered 2016-07-31: 30 mg via INTRAVENOUS
  Filled 2016-07-30: qty 1

## 2016-07-30 MED ORDER — ENOXAPARIN SODIUM 40 MG/0.4ML ~~LOC~~ SOLN
40.0000 mg | SUBCUTANEOUS | Status: DC
  Administered 2016-07-31: 40 mg via SUBCUTANEOUS
  Filled 2016-07-30: qty 0.4

## 2016-07-30 MED ORDER — IOPAMIDOL (ISOVUE-300) INJECTION 61%
INTRAVENOUS | Status: AC
  Filled 2016-07-30: qty 50

## 2016-07-30 MED ORDER — DIPHENHYDRAMINE HCL 12.5 MG/5ML PO ELIX
12.5000 mg | ORAL_SOLUTION | Freq: Four times a day (QID) | ORAL | Status: DC | PRN
Start: 2016-07-30 — End: 2016-07-31

## 2016-07-30 MED ORDER — LIDOCAINE HCL (CARDIAC) 20 MG/ML IV SOLN
INTRAVENOUS | Status: DC | PRN
Start: 2016-07-30 — End: 2016-07-30
  Administered 2016-07-30: 100 mg via INTRAVENOUS

## 2016-07-30 MED ORDER — HYDROMORPHONE HCL 1 MG/ML IJ SOLN
0.5000 mg | INTRAMUSCULAR | Status: DC | PRN
  Filled 2016-07-30: qty 1

## 2016-07-30 MED ORDER — DIPHENHYDRAMINE HCL 50 MG/ML IJ SOLN: 12.5000 mg | Freq: Four times a day (QID) | INTRAMUSCULAR | Status: DC | PRN

## 2016-07-30 MED ORDER — ACETAMINOPHEN 650 MG RE SUPP: 650.0000 mg | Freq: Four times a day (QID) | RECTAL | Status: DC | PRN

## 2016-07-30 MED ORDER — IBUPROFEN 400 MG PO TABS: 800.0000 mg | ORAL_TABLET | Freq: Three times a day (TID) | ORAL | Status: DC | PRN

## 2016-07-30 MED ORDER — KETOROLAC TROMETHAMINE 30 MG/ML IJ SOLN
INTRAMUSCULAR | Status: AC
  Filled 2016-07-30: qty 1

## 2016-07-30 MED ORDER — PROMETHAZINE HCL 25 MG/ML IJ SOLN: 6.2500 mg | INTRAMUSCULAR | Status: DC | PRN

## 2016-07-30 MED ORDER — 0.9 % SODIUM CHLORIDE (POUR BTL) OPTIME
TOPICAL | Status: DC | PRN
  Administered 2016-07-30: 1000 mL

## 2016-07-30 MED ORDER — BUPIVACAINE-EPINEPHRINE 0.25% -1:200000 IJ SOLN
INTRAMUSCULAR | Status: DC | PRN
Start: 2016-07-30 — End: 2016-07-30
  Administered 2016-07-30: 30 mL

## 2016-07-30 MED ORDER — HYDROMORPHONE HCL 1 MG/ML IJ SOLN
0.2500 mg | INTRAMUSCULAR | Status: DC | PRN
  Administered 2016-07-30 (×2): 0.5 mg via INTRAVENOUS

## 2016-07-30 MED ORDER — BUPIVACAINE-EPINEPHRINE 0.25% -1:200000 IJ SOLN
INTRAMUSCULAR | Status: AC
  Filled 2016-07-30: qty 1

## 2016-07-30 MED ORDER — PANTOPRAZOLE SODIUM 40 MG PO TBEC
40.0000 mg | DELAYED_RELEASE_TABLET | Freq: Every day | ORAL | Status: DC
  Administered 2016-07-30 – 2016-07-31 (×2): 40 mg via ORAL
  Filled 2016-07-30 (×2): qty 1

## 2016-07-30 MED ORDER — ONDANSETRON HCL 4 MG/2ML IJ SOLN
4.0000 mg | Freq: Four times a day (QID) | INTRAMUSCULAR | Status: DC | PRN
  Administered 2016-07-30: 4 mg via INTRAVENOUS

## 2016-07-30 MED ORDER — SUGAMMADEX SODIUM 200 MG/2ML IV SOLN
INTRAVENOUS | Status: DC | PRN
  Administered 2016-07-30: 210 mg via INTRAVENOUS

## 2016-07-30 MED ORDER — MIDAZOLAM HCL 5 MG/5ML IJ SOLN
INTRAMUSCULAR | Status: DC | PRN
  Administered 2016-07-30: 2 mg via INTRAVENOUS

## 2016-07-30 MED ORDER — POLYETHYLENE GLYCOL 3350 17 G PO PACK: 17.0000 g | PACK | Freq: Every day | ORAL | Status: DC | PRN

## 2016-07-30 MED ORDER — IOPAMIDOL (ISOVUE-300) INJECTION 61%
INTRAVENOUS | Status: DC | PRN
  Administered 2016-07-30: 9 mL

## 2016-07-30 MED ORDER — SIMETHICONE 80 MG PO CHEW: 40.0000 mg | CHEWABLE_TABLET | Freq: Four times a day (QID) | ORAL | Status: DC | PRN

## 2016-07-30 MED ORDER — PROPOFOL 10 MG/ML IV BOLUS
INTRAVENOUS | Status: DC | PRN
  Administered 2016-07-30: 50 mg via INTRAVENOUS
  Administered 2016-07-30: 30 mg via INTRAVENOUS
  Administered 2016-07-30: 170 mg via INTRAVENOUS

## 2016-07-30 MED ORDER — HYDROMORPHONE HCL 1 MG/ML IJ SOLN
INTRAMUSCULAR | Status: AC
  Filled 2016-07-30: qty 1

## 2016-07-30 MED ORDER — KETOROLAC TROMETHAMINE 30 MG/ML IJ SOLN
INTRAMUSCULAR | Status: DC | PRN
Start: 2016-07-30 — End: 2016-07-30
  Administered 2016-07-30: 30 mg via INTRAVENOUS

## 2016-07-30 MED ORDER — DEXTROSE IN LACTATED RINGERS 5 % IV SOLN
INTRAVENOUS | Status: DC
  Administered 2016-07-30 (×2): via INTRAVENOUS

## 2016-07-30 MED ORDER — ESMOLOL HCL 100 MG/10ML IV SOLN
INTRAVENOUS | Status: AC
  Filled 2016-07-30: qty 10

## 2016-07-30 MED ORDER — LACTATED RINGERS IV SOLN
INTRAVENOUS | Status: DC
  Administered 2016-07-30 (×3): via INTRAVENOUS

## 2016-07-30 MED ORDER — MIDAZOLAM HCL 2 MG/2ML IJ SOLN
INTRAMUSCULAR | Status: AC
  Filled 2016-07-30: qty 2

## 2016-07-30 MED ORDER — ESMOLOL HCL 100 MG/10ML IV SOLN
INTRAVENOUS | Status: DC | PRN
  Administered 2016-07-30: 20 mg via INTRAVENOUS

## 2016-07-30 MED ORDER — HYDROMORPHONE HCL 1 MG/ML IJ SOLN
0.5000 mg | INTRAMUSCULAR | Status: DC | PRN
  Administered 2016-07-30: 1 mg via INTRAVENOUS

## 2016-07-30 MED ORDER — LIDOCAINE 2% (20 MG/ML) 5 ML SYRINGE
INTRAMUSCULAR | Status: AC
  Filled 2016-07-30: qty 5

## 2016-07-30 SURGICAL SUPPLY — 53 items
APL SKNCLS STERI-STRIP NONHPOA (GAUZE/BANDAGES/DRESSINGS) ×1
APPLIER CLIP 5 13 M/L LIGAMAX5 (MISCELLANEOUS) ×3
BAG SPEC RTRVL 10 TROC 200 (ENDOMECHANICALS) ×1
BANDAGE ADH SHEER 1  50/CT (GAUZE/BANDAGES/DRESSINGS) ×9 IMPLANT
BENZOIN TINCTURE PRP APPL 2/3 (GAUZE/BANDAGES/DRESSINGS) ×3 IMPLANT
BLADE SURG ROTATE 9660 (MISCELLANEOUS) IMPLANT
CANISTER SUCTION 2500CC (MISCELLANEOUS) ×3 IMPLANT
CHLORAPREP W/TINT 26ML (MISCELLANEOUS) ×3 IMPLANT
CLIP APPLIE 5 13 M/L LIGAMAX5 (MISCELLANEOUS) ×1 IMPLANT
CLOSURE WOUND 1/2 X4 (GAUZE/BANDAGES/DRESSINGS) ×1
COVER MAYO STAND STRL (DRAPES) ×3 IMPLANT
COVER SURGICAL LIGHT HANDLE (MISCELLANEOUS) ×3 IMPLANT
DRAPE C-ARM 42X72 X-RAY (DRAPES) ×3 IMPLANT
DRSG TEGADERM 4X4.75 (GAUZE/BANDAGES/DRESSINGS) ×3 IMPLANT
ELECT REM PT RETURN 9FT ADLT (ELECTROSURGICAL) ×3
ELECTRODE REM PT RTRN 9FT ADLT (ELECTROSURGICAL) ×1 IMPLANT
GAUZE SPONGE 2X2 8PLY STRL LF (GAUZE/BANDAGES/DRESSINGS) ×1 IMPLANT
GLOVE BIO SURGEON STRL SZ8 (GLOVE) ×3 IMPLANT
GLOVE BIOGEL M STRL SZ7.5 (GLOVE) ×3 IMPLANT
GLOVE BIOGEL PI IND STRL 7.0 (GLOVE) ×2 IMPLANT
GLOVE BIOGEL PI IND STRL 8 (GLOVE) ×1 IMPLANT
GLOVE BIOGEL PI IND STRL 8.5 (GLOVE) ×1 IMPLANT
GLOVE BIOGEL PI INDICATOR 7.0 (GLOVE) ×4
GLOVE BIOGEL PI INDICATOR 8 (GLOVE) ×2
GLOVE BIOGEL PI INDICATOR 8.5 (GLOVE) ×2
GLOVE SURG SS PI 7.0 STRL IVOR (GLOVE) ×3 IMPLANT
GOWN STRL REUS W/ TWL LRG LVL3 (GOWN DISPOSABLE) ×2 IMPLANT
GOWN STRL REUS W/ TWL XL LVL3 (GOWN DISPOSABLE) ×1 IMPLANT
GOWN STRL REUS W/TWL 2XL LVL3 (GOWN DISPOSABLE) ×3 IMPLANT
GOWN STRL REUS W/TWL LRG LVL3 (GOWN DISPOSABLE) ×6
GOWN STRL REUS W/TWL XL LVL3 (GOWN DISPOSABLE) ×3
GRASPER SUT TROCAR 14GX15 (MISCELLANEOUS) ×3 IMPLANT
KIT BASIN OR (CUSTOM PROCEDURE TRAY) ×3 IMPLANT
KIT ROOM TURNOVER OR (KITS) ×3 IMPLANT
NS IRRIG 1000ML POUR BTL (IV SOLUTION) ×3 IMPLANT
PAD ARMBOARD 7.5X6 YLW CONV (MISCELLANEOUS) ×3 IMPLANT
POUCH RETRIEVAL ECOSAC 10 (ENDOMECHANICALS) ×1 IMPLANT
POUCH RETRIEVAL ECOSAC 10MM (ENDOMECHANICALS) ×2
SCISSORS LAP 5X35 DISP (ENDOMECHANICALS) ×3 IMPLANT
SET CHOLANGIOGRAPH 5 50 .035 (SET/KITS/TRAYS/PACK) ×3 IMPLANT
SET IRRIG TUBING LAPAROSCOPIC (IRRIGATION / IRRIGATOR) ×3 IMPLANT
SLEEVE ENDOPATH XCEL 5M (ENDOMECHANICALS) ×6 IMPLANT
SPECIMEN JAR SMALL (MISCELLANEOUS) ×3 IMPLANT
SPONGE GAUZE 2X2 STER 10/PKG (GAUZE/BANDAGES/DRESSINGS) ×2
STRIP CLOSURE SKIN 1/2X4 (GAUZE/BANDAGES/DRESSINGS) ×2 IMPLANT
SUT MNCRL AB 4-0 PS2 18 (SUTURE) ×3 IMPLANT
SUT VICRYL 0 UR6 27IN ABS (SUTURE) ×3 IMPLANT
TOWEL OR 17X24 6PK STRL BLUE (TOWEL DISPOSABLE) ×3 IMPLANT
TOWEL OR 17X26 10 PK STRL BLUE (TOWEL DISPOSABLE) IMPLANT
TRAY LAPAROSCOPIC MC (CUSTOM PROCEDURE TRAY) ×3 IMPLANT
TROCAR XCEL BLUNT TIP 100MML (ENDOMECHANICALS) ×3 IMPLANT
TROCAR XCEL NON-BLD 5MMX100MML (ENDOMECHANICALS) ×3 IMPLANT
TUBING INSUFFLATION (TUBING) ×3 IMPLANT

## 2016-07-30 NOTE — Transfer of Care (Signed)
Immediate Anesthesia Transfer of Care Note  Patient: Sandra PileKendra L Saunders  Procedure(s) Performed: Procedure(s): LAPAROSCOPIC CHOLECYSTECTOMY WITH INTRAOPERATIVE CHOLANGIOGRAM (N/A)  Patient Location: PACU  Anesthesia Type:General  Level of Consciousness: awake, alert  and oriented  Airway & Oxygen Therapy: Patient Spontanous Breathing and Patient connected to nasal cannula oxygen  Post-op Assessment: Report given to RN, Post -op Vital signs reviewed and stable and Patient moving all extremities X 4  Post vital signs: Reviewed and stable  Last Vitals:  Vitals:   07/30/16 0500 07/30/16 0822  BP: 109/66 110/60  Pulse: 69 63  Resp: 18 16  Temp: 36.9 C 37 C    Last Pain:  Vitals:   07/30/16 0822  TempSrc: Oral  PainSc: 2       Patients Stated Pain Goal: 2 (07/30/16 [id number])  Complications: No apparent anesthesia complications

## 2016-07-30 NOTE — Progress Notes (Signed)
Pt.transferred to the OR for gallbladder removal surgery, alert and oriented x 4, denies pain at this time, report called to OR nurse, v/s stable, no s/s of distress noted at the time of transfer.

## 2016-07-30 NOTE — Anesthesia Postprocedure Evaluation (Signed)
Anesthesia Post Note  Patient: Sandra PileKendra L Lubke  Procedure(s) Performed: Procedure(s) (LRB): LAPAROSCOPIC CHOLECYSTECTOMY WITH INTRAOPERATIVE CHOLANGIOGRAM (N/A)  Patient location during evaluation: PACU Anesthesia Type: General Level of consciousness: awake and alert Pain management: pain level controlled Vital Signs Assessment: post-procedure vital signs reviewed and stable Respiratory status: spontaneous breathing, nonlabored ventilation, respiratory function stable and patient connected to nasal cannula oxygen Cardiovascular status: blood pressure returned to baseline and stable Postop Assessment: no signs of nausea or vomiting Anesthetic complications: no    Last Vitals:  Vitals:   07/30/16 1230 07/30/16 1314  BP: 110/76 127/81  Pulse: 92 84  Resp: 12   Temp: 36.8 C 36.7 C    Last Pain:  Vitals:   07/30/16 1314  TempSrc: Oral  PainSc:                  Eirik Schueler J

## 2016-07-30 NOTE — Anesthesia Preprocedure Evaluation (Signed)
Anesthesia Evaluation  Patient identified by MRN, date of birth, ID band Patient awake    Reviewed: Allergy & Precautions, H&P , NPO status , Patient's Chart, lab work & pertinent test results  History of Anesthesia Complications (+) PONV and history of anesthetic complications  Airway Mallampati: II  TM Distance: >3 FB Neck ROM: full    Dental no notable dental hx.    Pulmonary neg pulmonary ROS,    Pulmonary exam normal breath sounds clear to auscultation       Cardiovascular negative cardio ROS Normal cardiovascular exam Rhythm:regular Rate:Normal     Neuro/Psych negative neurological ROS     GI/Hepatic negative GI ROS, Neg liver ROS,   Endo/Other  negative endocrine ROS  Renal/GU negative Renal ROS     Musculoskeletal   Abdominal   Peds  Hematology negative hematology ROS (+)   Anesthesia Other Findings   Reproductive/Obstetrics negative OB ROS                             Anesthesia Physical Anesthesia Plan  ASA: II  Anesthesia Plan: General   Post-op Pain Management:    Induction: Intravenous  Airway Management Planned: Oral ETT  Additional Equipment:   Intra-op Plan:   Post-operative Plan: Extubation in OR  Informed Consent: I have reviewed the patients History and Physical, chart, labs and discussed the procedure including the risks, benefits and alternatives for the proposed anesthesia with the patient or authorized representative who has indicated his/her understanding and acceptance.   Dental Advisory Given  Plan Discussed with: Anesthesiologist, CRNA and Surgeon  Anesthesia Plan Comments:         Anesthesia Quick Evaluation

## 2016-07-30 NOTE — Op Note (Signed)
Sandra Saunders [id number] 09-Jul-1991 07/30/2016  Laparoscopic Cholecystectomy with IOC Procedure Note  Indications: This patient presents with symptomatic gallbladder disease and will undergo laparoscopic cholecystectomy.  Pre-operative Diagnosis: acute calculous cholecystitis  Post-operative Diagnosis: Same  Surgeon: Atilano Ina   Assistants: none  Anesthesia: General endotracheal anesthesia  Procedure Details  The patient was seen again in the Holding Room. The risks, benefits, complications, treatment options, and expected outcomes were discussed with the patient. The possibilities of reaction to medication, pulmonary aspiration, perforation of viscus, bleeding, recurrent infection, finding a normal gallbladder, the need for additional procedures, failure to diagnose a condition, the possible need to convert to an open procedure, and creating a complication requiring transfusion or operation were discussed with the patient. The likelihood of improving the patient's symptoms with return to their baseline status is good.  The patient and/or family concurred with the proposed plan, giving informed consent. The site of surgery properly noted. The patient was taken to Operating Room, identified as Sandra Saunders and the procedure verified as Laparoscopic Cholecystectomy with Intraoperative Cholangiogram. A Time Out was held and the above information confirmed. Antibiotic prophylaxis was administered.   Prior to the induction of general anesthesia, antibiotic prophylaxis was administered. General endotracheal anesthesia was then administered and tolerated well. After the induction, the abdomen was prepped with Chloraprep and draped in the sterile fashion. The patient was positioned in the supine position.  Local anesthetic agent was injected into the skin near the umbilicus and an incision made. We dissected down to the abdominal fascia with blunt dissection.  The fascia was incised vertically and  we entered the peritoneal cavity bluntly.  A pursestring suture of 0-Vicryl was placed around the fascial opening.  The Hasson cannula was inserted and secured with the stay suture.  Pneumoperitoneum was then created with CO2 and tolerated well without any adverse changes in the patient's vital signs. An 5-mm port was placed in the subxiphoid position.  Two 5-mm ports were placed in the right upper quadrant. All skin incisions were infiltrated with a local anesthetic agent before making the incision and placing the trocars.   We positioned the patient in reverse Trendelenburg, tilted slightly to the patient's left.  The gallbladder was identified, the fundus grasped and retracted cephalad. She had a stone lodged at the infundibulum.  Adhesions were lysed bluntly and with the electrocautery where indicated, taking care not to injure any adjacent organs or viscus. The infundibulum was grasped and retracted laterally, exposing the peritoneum overlying the triangle of Calot. This was then divided and exposed in a blunt fashion. A critical view of the cystic duct and cystic artery was obtained.  The cystic duct was clearly identified and bluntly dissected circumferentially. The cystic duct was ligated with a clip distally.   An incision was made in the cystic duct and the [facility] cholangiogram catheter introduced. The catheter was secured using a clip. A cholangiogram was then obtained which showed good visualization of the distal and proximal biliary tree with no sign of filling defects or obstruction.  Contrast flowed easily into the duodenum. The catheter was then removed.   The cystic duct was then ligated with clips and divided. The cystic artery which had been identified & dissected free was ligated with clips and divided as well.   The gallbladder was dissected from the liver bed in retrograde fashion with the electrocautery. There was a small posterior branch of the cystic artery which was clipped. The  gallbladder was removed and placed  in an Ecco sac.  The gallbladder and Ecco sac were then removed through the umbilical port site. The liver bed was irrigated and inspected. Hemostasis was achieved with the electrocautery. Copious irrigation was utilized and was repeatedly aspirated until clear.  The pursestring suture was used to close the umbilical fascia.  While there was no air leak there was a small diastasis so I placed an additional interrupted 0 vicryl suture using the endoclose suture device with laparoscopic assistance.   We again inspected the right upper quadrant for hemostasis.  The umbilical closure was inspected and there was no air leak and nothing trapped within the closure. Pneumoperitoneum was released as we removed the trocars.  4-0 Monocryl was used to close the skin.   Benzoin, steri-strips, and clean dressings were applied. The patient was then extubated and brought to the recovery room in stable condition. Instrument, sponge, and needle counts were correct at closure and at the conclusion of the case.   Findings: Cholecystitis with Cholelithiasis; +critical view, nml IOC  Estimated Blood Loss: Minimal         Drains: none         Specimens: Gallbladder           Complications: None; patient tolerated the procedure well.         Disposition: PACU - hemodynamically stable.         Condition: stable  Mary SellaEric M. Andrey CampanileWilson, MD, FACS General, Bariatric, & Minimally Invasive Surgery [facility] [place]

## 2016-07-30 NOTE — Progress Notes (Signed)
Pt has arrived to 5 west with ED Nurse Tech, husband at the bedside. Pt denied pain and discomfort at this time. Alert and oriented x 4, advised about belonging policy, oriented to unit and equipments. Call light within a reach.

## 2016-07-30 NOTE — Anesthesia Procedure Notes (Signed)
Procedure Name: Intubation Date/Time: 07/30/2016 9:40 AM Performed by: Rosiland OzMEYERS, Ary Lavine Pre-anesthesia Checklist: Patient identified, Emergency Drugs available, Suction available, Patient being monitored and Timeout performed Patient Re-evaluated:Patient Re-evaluated prior to inductionOxygen Delivery Method: Circle system utilized Preoxygenation: Pre-oxygenation with 100% oxygen Intubation Type: IV induction Ventilation: Mask ventilation without difficulty Laryngoscope Size: Miller and 2 Grade View: Grade I Tube type: Oral Tube size: 7.0 mm Number of attempts: 1 Airway Equipment and Method: Stylet Placement Confirmation: ETT inserted through vocal cords under direct vision,  positive ETCO2 and breath sounds checked- equal and bilateral Secured at: 22 cm Tube secured with: Tape Dental Injury: Teeth and Oropharynx as per pre-operative assessment

## 2016-07-30 NOTE — ED Notes (Signed)
Admitting provider at bedside.

## 2016-07-30 NOTE — Care Management Note (Signed)
Case Management Note  Patient Details  Name: Sandra Saunders MRN: [id number] Date of Birth: [birth year]  Subjective/Objective:                 Patient admitted from home for cholecystectomy.  Patient independent, for home with spouse. Sx today, no DC needs identified.    Action/Plan:  Anticipate DC to home, self care.  Expected Discharge Date:                  Expected Discharge Plan:  Home/Self Care  In-House Referral:  NA  Discharge planning Services  CM Consult  Post Acute Care Choice:  NA Choice offered to:  NA  DME Arranged:  N/A DME Agency:  NA  HH Arranged:  NA HH Agency:  NA  Status of Service:  Completed, signed off  If discussed at Long Length of Stay Meetings, dates discussed:    Additional Comments:  Lawerance SabalDebbie Danyl Deems, RN 07/30/2016, 11:42 AM

## 2016-07-31 ENCOUNTER — Encounter (HOSPITAL_COMMUNITY): Payer: Self-pay | Admitting: General Surgery

## 2016-07-31 MED ORDER — OXYCODONE-ACETAMINOPHEN 5-325 MG PO TABS: 1.0000 | ORAL_TABLET | ORAL | 0 refills | Status: DC | PRN

## 2016-07-31 NOTE — Discharge Instructions (Signed)
[facility] ______[facility] °LAPAROSCOPIC SURGERY: POST OP INSTRUCTIONS °Always review your discharge instruction sheet given to you by the facility where your surgery was performed. °IF YOU HAVE DISABILITY OR FAMILY LEAVE FORMS, YOU MUST BRING THEM TO THE OFFICE FOR PROCESSING.   °DO NOT GIVE THEM TO YOUR DOCTOR. ° °1. A prescription for pain medication may be given to you upon discharge.  Take your pain medication as prescribed, if needed.  If narcotic pain medicine is not needed, then you may take acetaminophen (Tylenol) or ibuprofen (Advil) as needed. °2. Take your usually prescribed medications unless otherwise directed. °3. If you need a refill on your pain medication, please contact your pharmacy.  They will contact our office to request authorization. Prescriptions will not be filled after 5pm or on week-ends. °4. You should follow a light diet the first few days after arrival home, such as soup and crackers, etc.  Be sure to include lots of fluids daily. °5. Most patients will experience some swelling and bruising in the area of the incisions.  Ice packs will help.  Swelling and bruising can take several days to resolve.  °6. It is common to experience some constipation if taking pain medication after surgery.  Increasing fluid intake and taking a stool softener (such as Colace) will usually help or prevent this problem from occurring.  A mild laxative (Milk of Magnesia or Miralax) should be taken according to package instructions if there are no bowel movements after 48 hours. °7. Unless discharge instructions indicate otherwise, you may remove your bandages 24-48 hours after surgery, and you may shower at that time.  You may have steri-strips (small skin tapes) in place directly over the incision.  These strips should be left on the skin for 7-10 days.  If your surgeon used skin glue on the incision, you may shower in 24 hours.  The glue will flake off over the next 2-3 weeks.  Any sutures or  staples will be removed at the office during your follow-up visit. °8. ACTIVITIES:  You may resume regular (light) daily activities beginning the next day--such as daily self-care, walking, climbing stairs--gradually increasing activities as tolerated.  You may have sexual intercourse when it is comfortable.  Refrain from any heavy lifting or straining until approved by your doctor. °a. You may drive when you are no longer taking prescription pain medication, you can comfortably wear a seatbelt, and you can safely maneuver your car and apply brakes. °b. RETURN TO WORK:  __________________________________________________________ °9. You should see your doctor in the office for a follow-up appointment approximately 2-3 weeks after your surgery.  Make sure that you call for this appointment within a day or two after you arrive home to insure a convenient appointment time. °10. OTHER INSTRUCTIONS: __________________________________________________________________________________________________________________________ __________________________________________________________________________________________________________________________ °WHEN TO CALL YOUR DOCTOR: °1. Fever over 101.0 °2. Inability to urinate °3. Continued bleeding from incision. °4. Increased pain, redness, or drainage from the incision. °5. Increasing abdominal pain ° °The clinic staff is available to answer your questions during regular business hours.  Please don’t hesitate to call and ask to speak to one of the nurses for clinical concerns.  If you have a medical emergency, go to the nearest emergency room or call 911.  A surgeon from [facility] is always on call at the hospital. °[address], [address], [place], [place]  [address] ? [address] [address], [place], [place]   [address] °[phone] ? [phone] ? FAX [phone] °Web site:   [email or web address] °

## 2016-07-31 NOTE — Progress Notes (Signed)
Patient ID: Sandra Saunders, female   DOB: 10/07/1991, 25 y.o.   MRN: [id number]   LOS: 1 day   Subjective: No c/o. Pain controlled, tolerated diet, feels better, ready to go home.   Objective: Vital signs in last 24 hours: Temp:  [97.7 F (36.5 C)-98.8 F (37.1 C)] 98.8 F (37.1 C) (10/12 0557) Pulse Rate:  [78-92] 87 (10/12 0557) Resp:  [8-20] 18 (10/12 0557) BP: (106-129)/(66-104) 111/73 (10/12 0557) SpO2:  [92 %-100 %] 96 % (10/12 0557) Last BM Date: 07/29/16   Physical Exam General appearance: alert and no distress Resp: clear to auscultation bilaterally Cardio: regular rate and rhythm GI: Soft, +BS, port sites WNL   Assessment/Plan: Acute calculous cholecystitis s/p lap chole -- D/C home    Freeman CaldronMichael J. Christofer Shen, PA-C Pager: [phone] 07/31/2016

## 2016-07-31 NOTE — Discharge Summary (Signed)
Physician Discharge Summary  Patient ID: Sandra Saunders MRN: [id number] DOB/AGE: 11/29/1990 24 y.o.  Admit date: 07/29/2016 Discharge date: 07/31/2016  Discharge Diagnoses Patient Active Problem List   Diagnosis Date Noted  . Acute calculous cholecystitis 07/30/2016  . Postpartum state 09/04/2015  . Pregnancy 09/03/2015    Consultants None   Procedures 10/11 -- Laparoscopic cholecystectomy with intraoperative cholangiogram by Dr. Gaynelle Adu   HPI: Patient is a 25 year old female who came to the emergency department yesterday and today for right upper quadrant/epigastric pain. She was prescribed Norco and Zofran yesterday when her right upper quadrant ultrasound demonstrated sludge in her gallbladder. She had normal liver function tests.  She had recurrent pain today when she had a hot dog for lunch. She did not take the Norco because she was afraid it would make her sleepy and she has young children at home. She has also had problems with nausea. Because she was unable to be pain free, the emergency department repeated her right upper quadrant ultrasound. This ultrasound demonstrated a 1.7 cm stone lodged in the neck of the gallbladder. She was admitted to the general surgical service for cholecystectomy.   Hospital Course: The patient underwent surgery the following day without complication. The cholangiogram was clear. By the next morning she was feeling better, tolerating a diet, and had her pain controlled on oral medications. She was discharged home in good condition.     Medication List    STOP taking these medications   HYDROcodone-acetaminophen 5-325 MG tablet Commonly known as:  NORCO/VICODIN     TAKE these medications   ibuprofen 800 MG tablet Commonly known as:  ADVIL,MOTRIN Take 1 tablet (800 mg total) by mouth 3 (three) times daily.   levonorgestrel 20 MCG/24HR IUD Commonly known as:  MIRENA 1 each by Intrauterine route once.   ondansetron 4 MG  tablet Commonly known as:  ZOFRAN Take 1 tablet (4 mg total) by mouth every 6 (six) hours.   oxyCODONE-acetaminophen 5-325 MG tablet Commonly known as:  ROXICET Take 1-2 tablets by mouth every 4 (four) hours as needed (Pain).   traMADol 50 MG tablet Commonly known as:  ULTRAM Take 1 tablet (50 mg total) by mouth every 6 (six) hours as needed.       Follow-up Information    Atilano Ina, MD Follow up in 3 week(s).   Specialty:  General Surgery Why:  The office will call you with an appointment date and time. Contact information: [address] [place] [address] [phone]            Signed: Freeman Caldron, PA-C Pager: [phone] General Trauma PA Pager: [phone] 07/31/2016, 11:54 AM

## 2016-11-13 ENCOUNTER — Other Ambulatory Visit: Payer: Self-pay | Admitting: Obstetrics and Gynecology

## 2016-11-14 LAB — CYTOLOGY - PAP

## 2017-05-17 IMAGING — US US ABDOMEN LIMITED
1 series · 14 of 25 positions shown · non-contrast
Comparison: None.

CLINICAL DATA: Right upper quadrant pain and vomiting

EXAM:
US ABDOMEN LIMITED - RIGHT UPPER QUADRANT

[Series 1: us abdomen limited · 0.25mm/px · 14 of 50 slices shown]
[im 1/50]
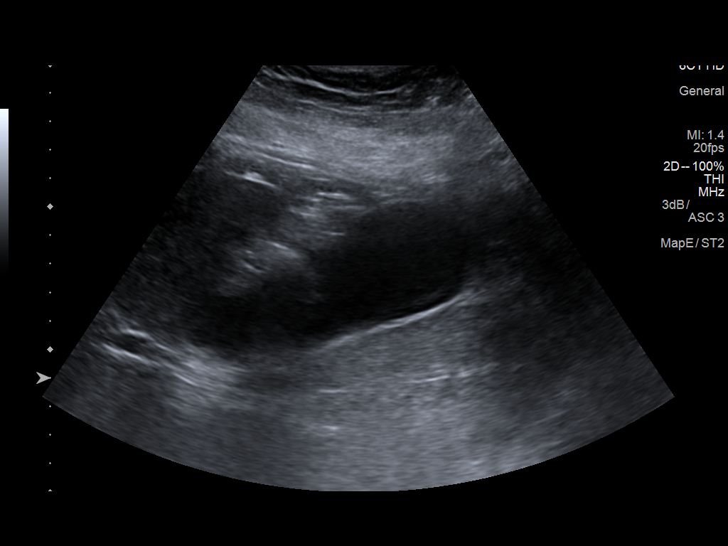
[im 5/50]
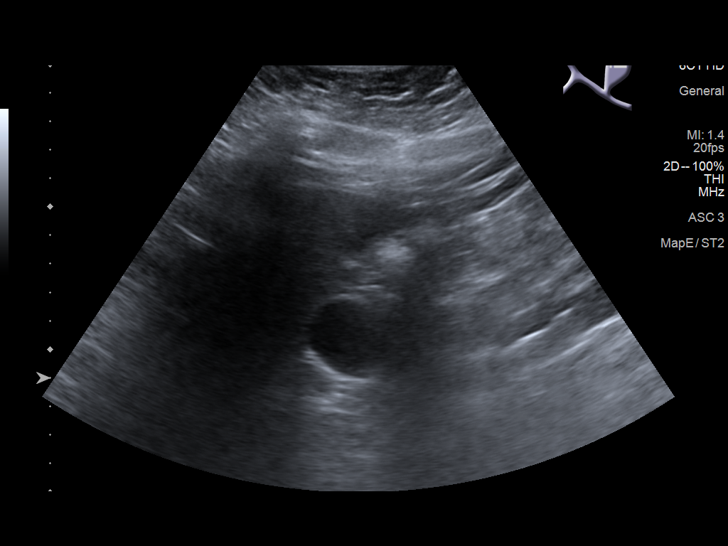
[im 9/50]
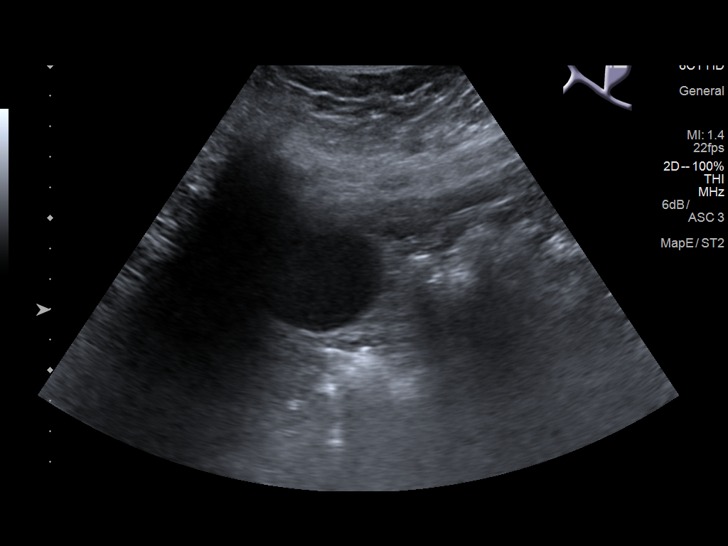
[im 13/50]
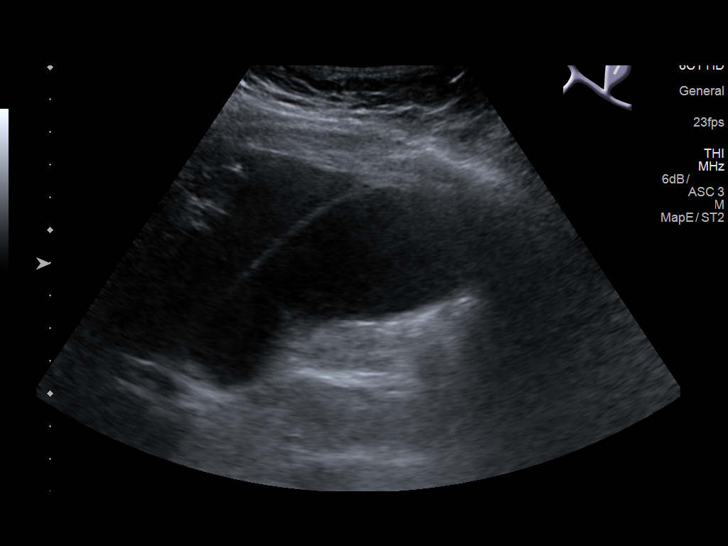
[im 17/50]
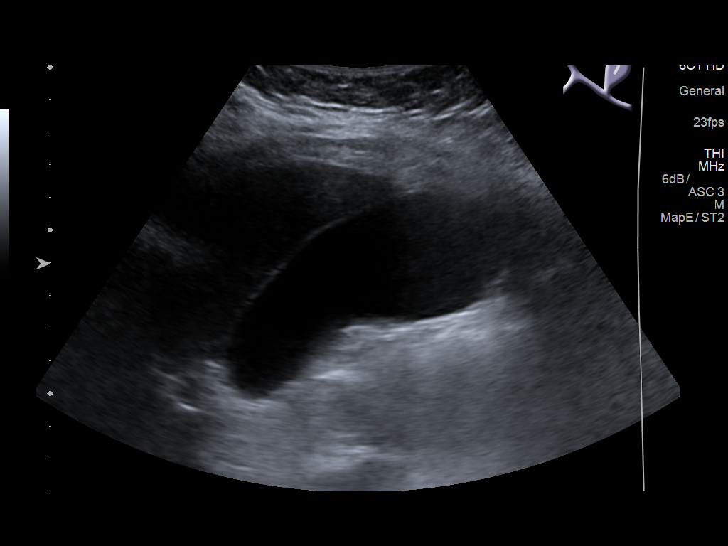
[im 19/50]
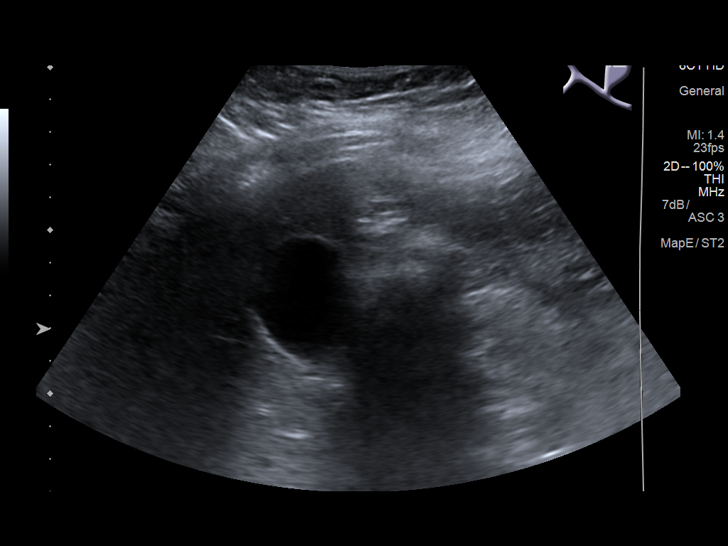
[im 23/50]
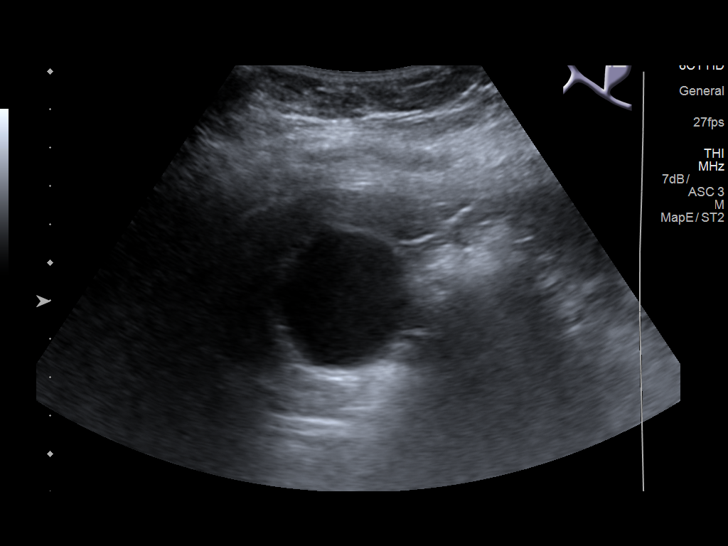
[im 27/50]
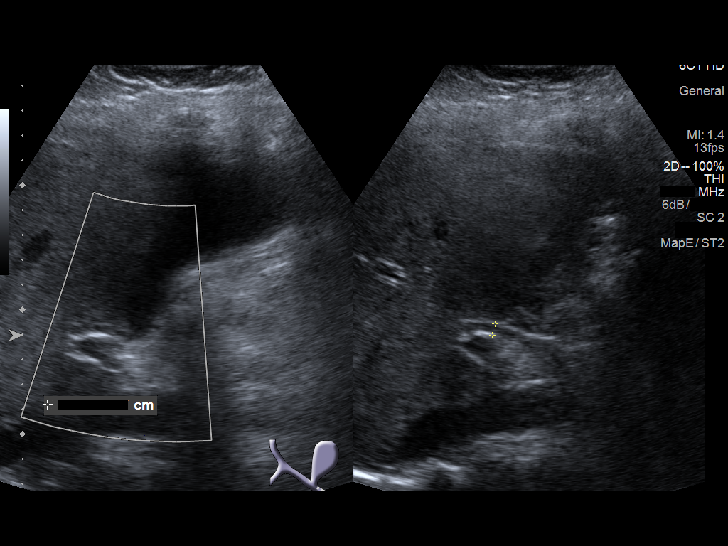
[im 31/50]
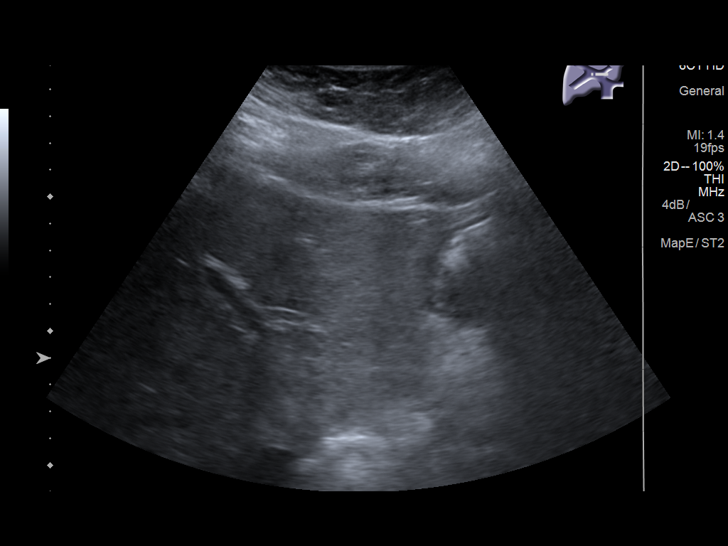
[im 33/50]
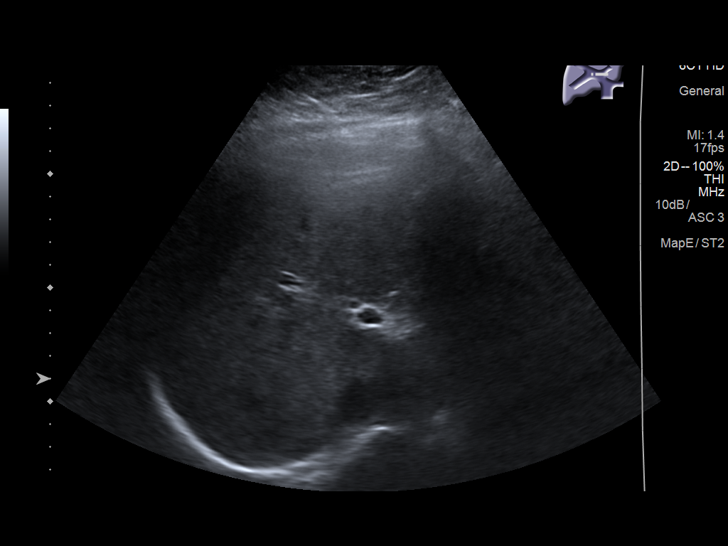
[im 37/50]
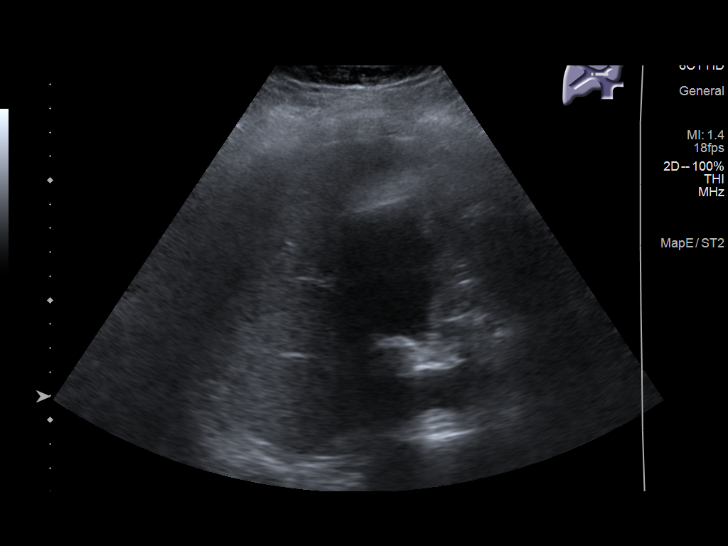
[im 41/50]
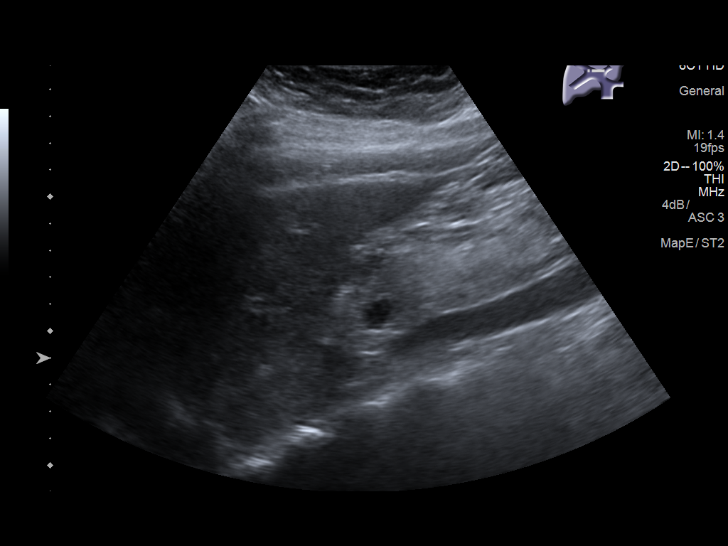
[im 45/50]
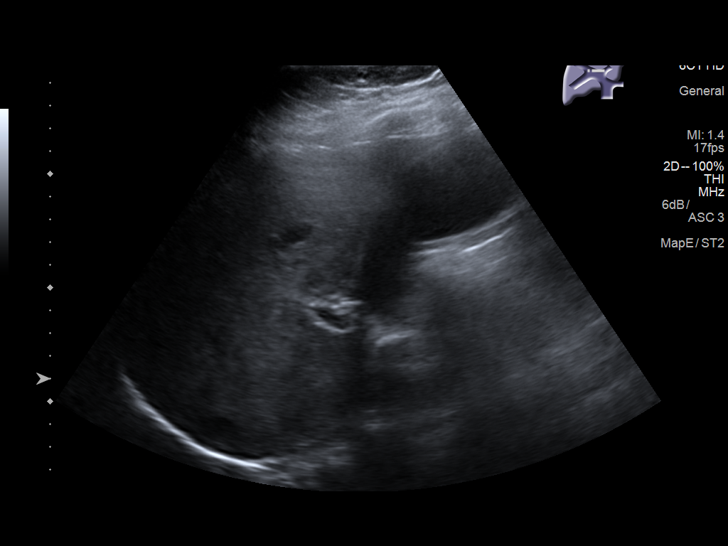
[im 50/50]
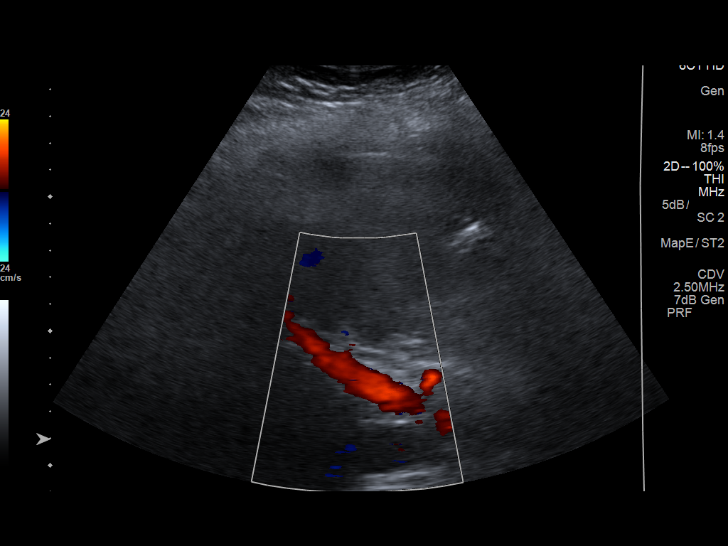

[14 of 25 positions shown; findings below may reference images not displayed]

FINDINGS: Gallbladder:

There is a small amount of sludge within the gallbladder. No
positive sonographic Murphy sign was demonstrated by the
sonographer. There is no gallbladder wall thickening or
pericholecystic fluid.

Common bile duct:

Diameter: 4.7 mm, within normal limits

Liver:

No focal liver lesion is identified. The hepatic echotexture is
echogenic.
IMPRESSION: 1. Gallbladder sludge without other evidence of acute cholecystitis.
2. Suspected mild hepatic steatosis.  The

## 2017-10-31 IMAGING — RF DG CHOLANGIOGRAM OPERATIVE
1 series · 6 of 6 positions shown · non-contrast
Comparison: Right upper quadrant abdominal ultrasound - 07/29/2016

CLINICAL DATA: Intraoperative cholangiogram during laparoscopic
cholecystectomy.

EXAM:
INTRAOPERATIVE CHOLANGIOGRAM
FLUOROSCOPY TIME:  6 seconds

[Series 1: run · 3 acquisitions, 6 frames shown]
[im 1/3]
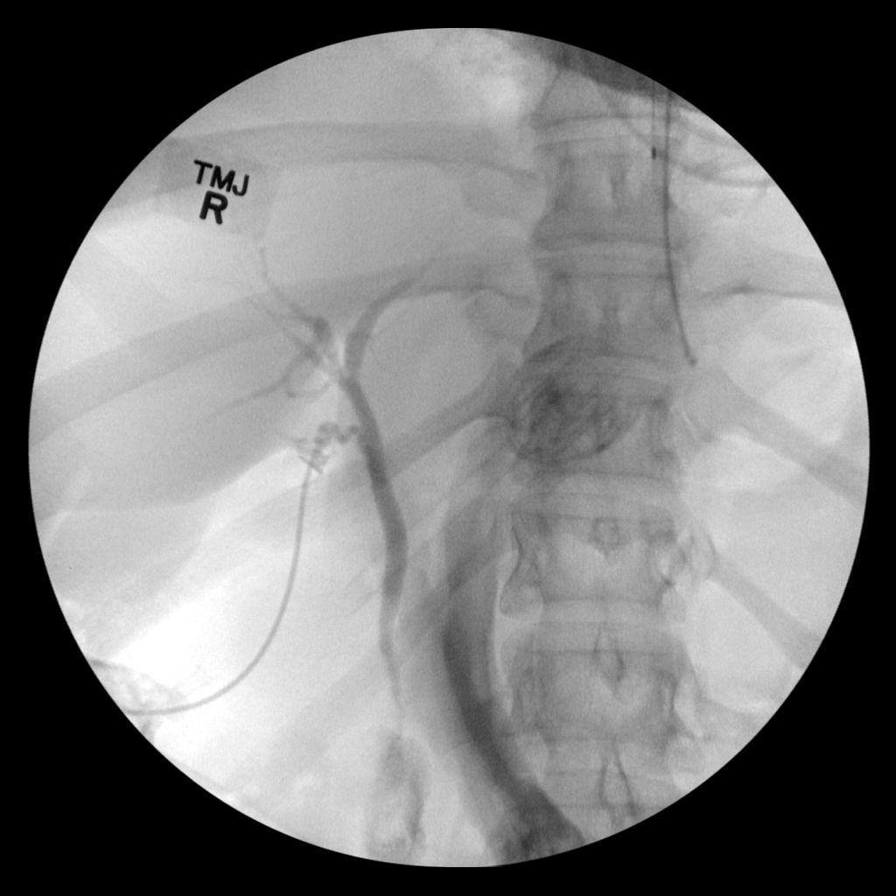
[im 1/3]
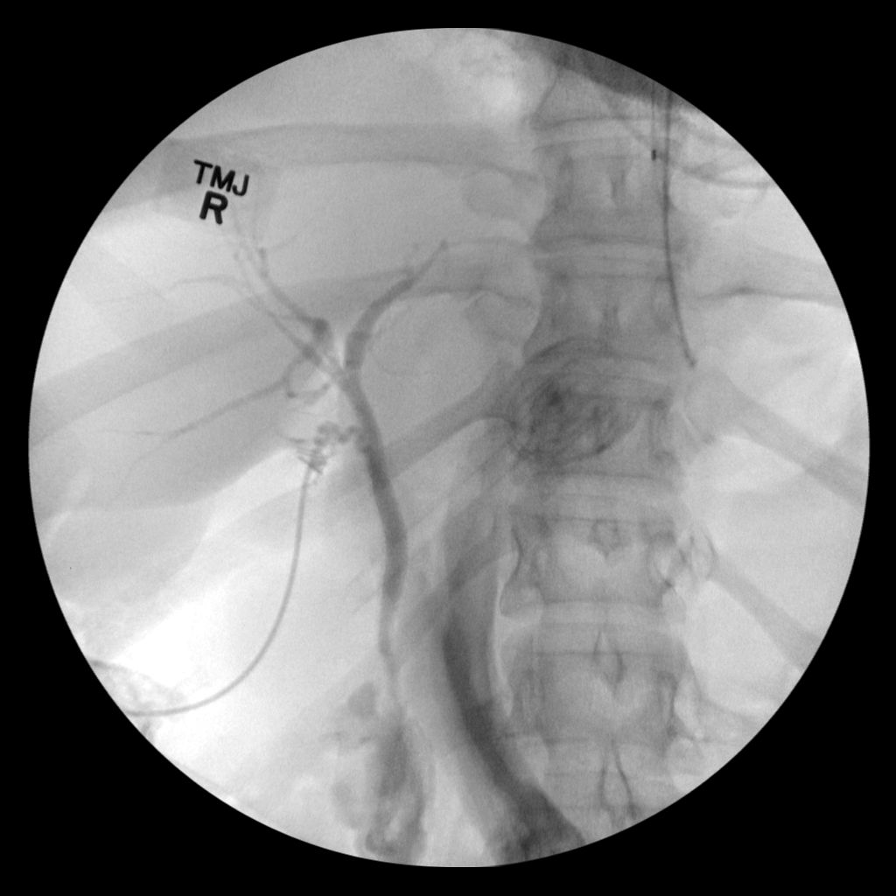
[im 1/3]
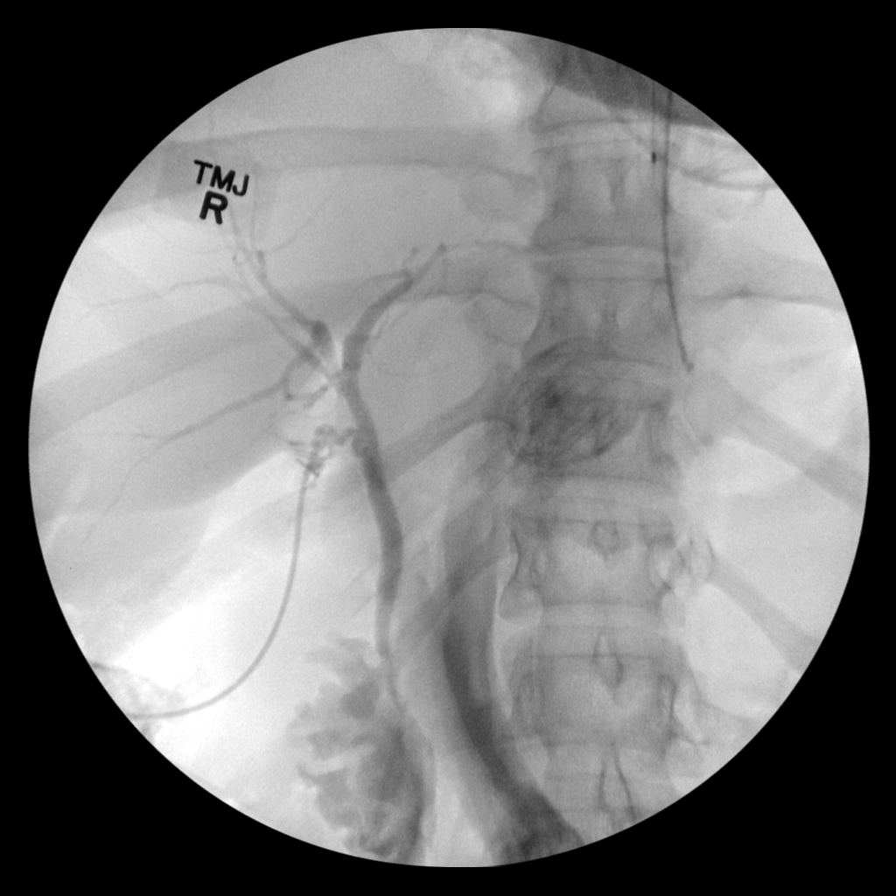
[im 1/3]
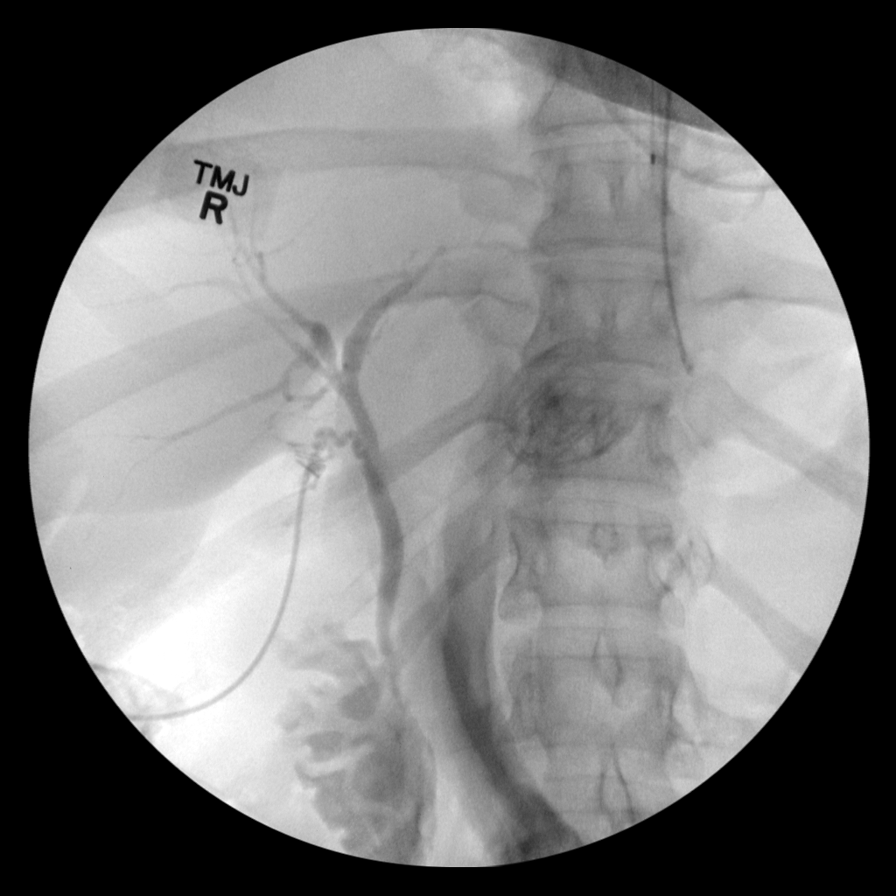
[im 2/3]
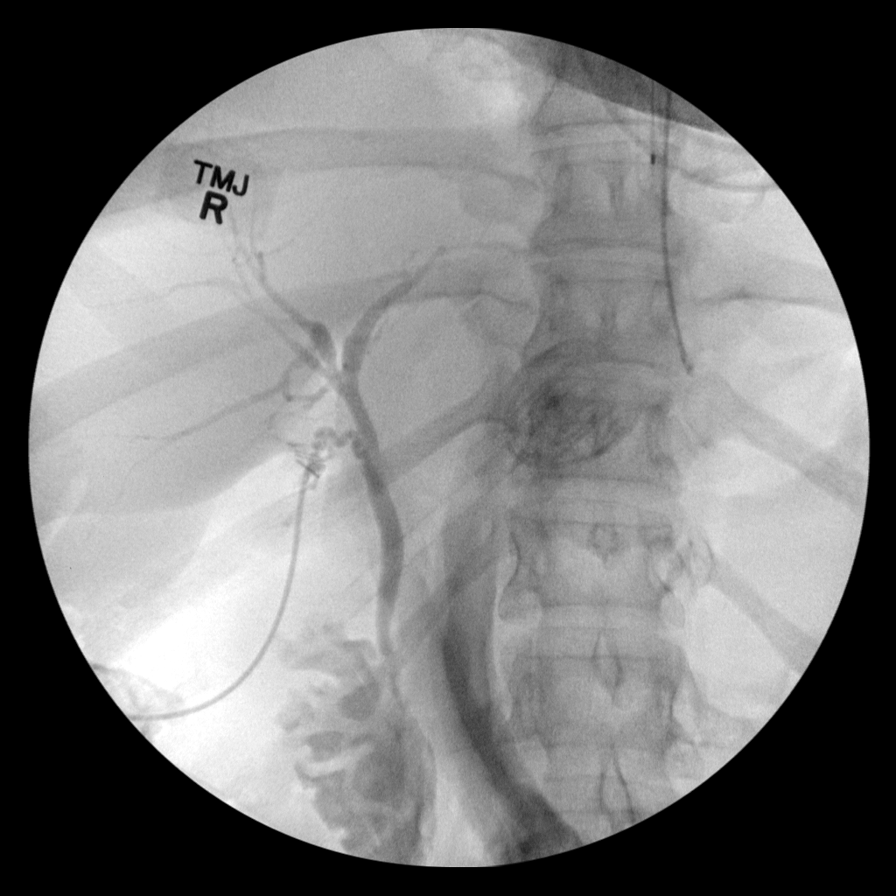
[im 3/3]
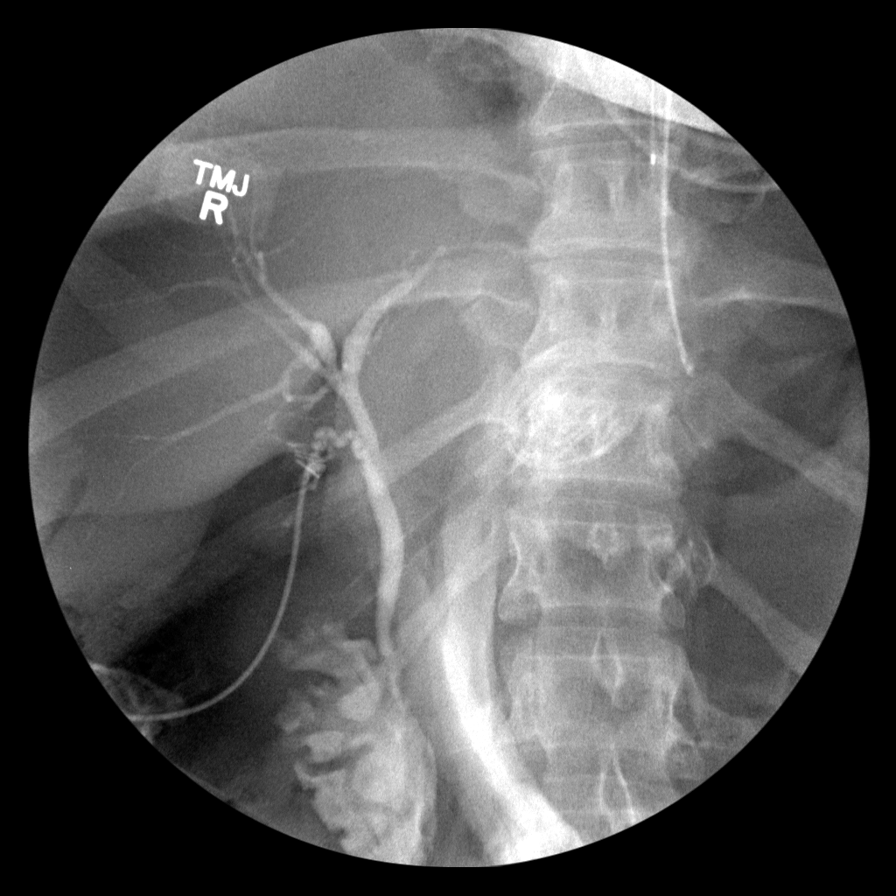

[6 of 6 positions shown; findings below may reference images not displayed]

FINDINGS: Intraoperative cholangiographic images of the right upper abdominal
quadrant during laparoscopic cholecystectomy are provided for
review.

Surgical clips overlie the expected location of the gallbladder
fossa.

Contrast injection demonstrates selective cannulation of the central
aspect of the cystic duct.

There is passage of contrast through the central aspect of the
cystic duct with filling of a non dilated common bile duct. There is
passage of contrast though the CBD and into the descending portion
of the duodenum.

There is minimal reflux of injected contrast into the common hepatic
duct and central aspect of the non dilated intrahepatic biliary
system.

There are no discrete persistent filling defects within the
opacified portions of the biliary system to suggest the presence of
choledocholithiasis.
IMPRESSION: No evidence of choledocholithiasis.

## 2020-02-15 ENCOUNTER — Ambulatory Visit (INDEPENDENT_AMBULATORY_CARE_PROVIDER_SITE_OTHER): Payer: Self-pay | Admitting: Family Medicine

## 2020-02-29 ENCOUNTER — Ambulatory Visit (INDEPENDENT_AMBULATORY_CARE_PROVIDER_SITE_OTHER): Payer: Self-pay | Admitting: Family Medicine

## 2021-02-22 DIAGNOSIS — Z01419 Encounter for gynecological examination (general) (routine) without abnormal findings: Secondary | ICD-10-CM | POA: Diagnosis not present

## 2021-07-15 DIAGNOSIS — Z20822 Contact with and (suspected) exposure to covid-19: Secondary | ICD-10-CM | POA: Diagnosis not present

## 2021-08-14 DIAGNOSIS — Z20822 Contact with and (suspected) exposure to covid-19: Secondary | ICD-10-CM | POA: Diagnosis not present

## 2021-08-19 DIAGNOSIS — J101 Influenza due to other identified influenza virus with other respiratory manifestations: Secondary | ICD-10-CM | POA: Diagnosis not present

## 2021-08-19 DIAGNOSIS — R059 Cough, unspecified: Secondary | ICD-10-CM | POA: Diagnosis not present

## 2021-08-19 DIAGNOSIS — Z6841 Body Mass Index (BMI) 40.0 and over, adult: Secondary | ICD-10-CM | POA: Diagnosis not present

## 2021-08-19 DIAGNOSIS — R509 Fever, unspecified: Secondary | ICD-10-CM | POA: Diagnosis not present

## 2021-09-11 DIAGNOSIS — Z0000 Encounter for general adult medical examination without abnormal findings: Secondary | ICD-10-CM | POA: Diagnosis not present

## 2021-09-11 DIAGNOSIS — Z13220 Encounter for screening for lipoid disorders: Secondary | ICD-10-CM | POA: Diagnosis not present

## 2021-09-11 DIAGNOSIS — Z130 Encounter for screening for diseases of the blood and blood-forming organs and certain disorders involving the immune mechanism: Secondary | ICD-10-CM | POA: Diagnosis not present

## 2021-09-16 DIAGNOSIS — Z13220 Encounter for screening for lipoid disorders: Secondary | ICD-10-CM | POA: Diagnosis not present

## 2021-09-16 DIAGNOSIS — Z130 Encounter for screening for diseases of the blood and blood-forming organs and certain disorders involving the immune mechanism: Secondary | ICD-10-CM | POA: Diagnosis not present

## 2021-09-16 DIAGNOSIS — Z1329 Encounter for screening for other suspected endocrine disorder: Secondary | ICD-10-CM | POA: Diagnosis not present

## 2021-09-16 DIAGNOSIS — Z6841 Body Mass Index (BMI) 40.0 and over, adult: Secondary | ICD-10-CM | POA: Diagnosis not present

## 2021-10-17 DIAGNOSIS — F4321 Adjustment disorder with depressed mood: Secondary | ICD-10-CM | POA: Diagnosis not present

## 2021-10-17 DIAGNOSIS — F419 Anxiety disorder, unspecified: Secondary | ICD-10-CM | POA: Diagnosis not present

## 2021-12-13 DIAGNOSIS — F419 Anxiety disorder, unspecified: Secondary | ICD-10-CM | POA: Diagnosis not present

## 2021-12-13 DIAGNOSIS — F4321 Adjustment disorder with depressed mood: Secondary | ICD-10-CM | POA: Diagnosis not present

## 2021-12-13 DIAGNOSIS — F321 Major depressive disorder, single episode, moderate: Secondary | ICD-10-CM | POA: Diagnosis not present

## 2021-12-17 DIAGNOSIS — F321 Major depressive disorder, single episode, moderate: Secondary | ICD-10-CM | POA: Diagnosis not present

## 2021-12-17 DIAGNOSIS — F411 Generalized anxiety disorder: Secondary | ICD-10-CM | POA: Diagnosis not present

## 2021-12-23 DIAGNOSIS — L700 Acne vulgaris: Secondary | ICD-10-CM | POA: Diagnosis not present

## 2021-12-23 DIAGNOSIS — D2262 Melanocytic nevi of left upper limb, including shoulder: Secondary | ICD-10-CM | POA: Diagnosis not present

## 2021-12-23 DIAGNOSIS — L814 Other melanin hyperpigmentation: Secondary | ICD-10-CM | POA: Diagnosis not present

## 2021-12-23 DIAGNOSIS — D2261 Melanocytic nevi of right upper limb, including shoulder: Secondary | ICD-10-CM | POA: Diagnosis not present

## 2021-12-23 DIAGNOSIS — R6252 Short stature (child): Secondary | ICD-10-CM | POA: Diagnosis not present

## 2021-12-25 DIAGNOSIS — F411 Generalized anxiety disorder: Secondary | ICD-10-CM | POA: Diagnosis not present

## 2021-12-25 DIAGNOSIS — F321 Major depressive disorder, single episode, moderate: Secondary | ICD-10-CM | POA: Diagnosis not present

## 2021-12-31 DIAGNOSIS — F321 Major depressive disorder, single episode, moderate: Secondary | ICD-10-CM | POA: Diagnosis not present

## 2021-12-31 DIAGNOSIS — F411 Generalized anxiety disorder: Secondary | ICD-10-CM | POA: Diagnosis not present

## 2022-01-23 DIAGNOSIS — F321 Major depressive disorder, single episode, moderate: Secondary | ICD-10-CM | POA: Diagnosis not present

## 2022-01-23 DIAGNOSIS — F411 Generalized anxiety disorder: Secondary | ICD-10-CM | POA: Diagnosis not present

## 2022-01-28 DIAGNOSIS — F321 Major depressive disorder, single episode, moderate: Secondary | ICD-10-CM | POA: Diagnosis not present

## 2022-01-28 DIAGNOSIS — F411 Generalized anxiety disorder: Secondary | ICD-10-CM | POA: Diagnosis not present

## 2022-02-19 DIAGNOSIS — F411 Generalized anxiety disorder: Secondary | ICD-10-CM | POA: Diagnosis not present

## 2022-02-19 DIAGNOSIS — F321 Major depressive disorder, single episode, moderate: Secondary | ICD-10-CM | POA: Diagnosis not present

## 2022-03-20 DIAGNOSIS — F411 Generalized anxiety disorder: Secondary | ICD-10-CM | POA: Diagnosis not present

## 2022-03-20 DIAGNOSIS — F321 Major depressive disorder, single episode, moderate: Secondary | ICD-10-CM | POA: Diagnosis not present

## 2022-03-27 DIAGNOSIS — Z01419 Encounter for gynecological examination (general) (routine) without abnormal findings: Secondary | ICD-10-CM | POA: Diagnosis not present

## 2022-03-27 DIAGNOSIS — Z6841 Body Mass Index (BMI) 40.0 and over, adult: Secondary | ICD-10-CM | POA: Diagnosis not present

## 2022-03-27 DIAGNOSIS — Z124 Encounter for screening for malignant neoplasm of cervix: Secondary | ICD-10-CM | POA: Diagnosis not present

## 2022-03-31 DIAGNOSIS — J069 Acute upper respiratory infection, unspecified: Secondary | ICD-10-CM | POA: Diagnosis not present

## 2022-04-01 ENCOUNTER — Other Ambulatory Visit: Payer: Self-pay | Admitting: Obstetrics and Gynecology

## 2022-04-01 DIAGNOSIS — J0100 Acute maxillary sinusitis, unspecified: Secondary | ICD-10-CM | POA: Diagnosis not present

## 2022-04-01 DIAGNOSIS — J029 Acute pharyngitis, unspecified: Secondary | ICD-10-CM | POA: Diagnosis not present

## 2022-04-01 DIAGNOSIS — Z6841 Body Mass Index (BMI) 40.0 and over, adult: Secondary | ICD-10-CM | POA: Diagnosis not present

## 2022-04-01 DIAGNOSIS — N644 Mastodynia: Secondary | ICD-10-CM

## 2022-04-14 DIAGNOSIS — F320 Major depressive disorder, single episode, mild: Secondary | ICD-10-CM | POA: Diagnosis not present

## 2022-04-14 DIAGNOSIS — F411 Generalized anxiety disorder: Secondary | ICD-10-CM | POA: Diagnosis not present

## 2022-04-15 ENCOUNTER — Ambulatory Visit
Admission: RE | Admit: 2022-04-15 | Discharge: 2022-04-15 | Disposition: A | Discharge status: Home / Self Care | Payer: BC Managed Care – PPO | Source: Ambulatory Visit | Attending: Obstetrics and Gynecology | Admitting: Obstetrics and Gynecology

## 2022-04-15 ENCOUNTER — Ambulatory Visit
Admission: RE | Admit: 2022-04-15 | Discharge: 2022-04-15 | Disposition: A | Discharge status: Home / Self Care | Payer: Self-pay | Source: Ambulatory Visit | Attending: Obstetrics and Gynecology | Admitting: Obstetrics and Gynecology

## 2022-04-15 ENCOUNTER — Other Ambulatory Visit: Payer: Self-pay | Admitting: Obstetrics and Gynecology

## 2022-04-15 DIAGNOSIS — N644 Mastodynia: Secondary | ICD-10-CM | POA: Diagnosis not present

## 2022-04-15 DIAGNOSIS — N6310 Unspecified lump in the right breast, unspecified quadrant: Secondary | ICD-10-CM

## 2022-04-28 DIAGNOSIS — F411 Generalized anxiety disorder: Secondary | ICD-10-CM | POA: Diagnosis not present

## 2022-04-28 DIAGNOSIS — F320 Major depressive disorder, single episode, mild: Secondary | ICD-10-CM | POA: Diagnosis not present

## 2022-05-20 DIAGNOSIS — K644 Residual hemorrhoidal skin tags: Secondary | ICD-10-CM | POA: Diagnosis not present

## 2022-05-28 ENCOUNTER — Encounter (INDEPENDENT_AMBULATORY_CARE_PROVIDER_SITE_OTHER): Payer: Self-pay

## 2022-06-05 DIAGNOSIS — F411 Generalized anxiety disorder: Secondary | ICD-10-CM | POA: Diagnosis not present

## 2022-06-05 DIAGNOSIS — F320 Major depressive disorder, single episode, mild: Secondary | ICD-10-CM | POA: Diagnosis not present

## 2022-07-07 DIAGNOSIS — F411 Generalized anxiety disorder: Secondary | ICD-10-CM | POA: Diagnosis not present

## 2022-07-07 DIAGNOSIS — F320 Major depressive disorder, single episode, mild: Secondary | ICD-10-CM | POA: Diagnosis not present

## 2022-07-17 DIAGNOSIS — F320 Major depressive disorder, single episode, mild: Secondary | ICD-10-CM | POA: Diagnosis not present

## 2022-07-17 DIAGNOSIS — F411 Generalized anxiety disorder: Secondary | ICD-10-CM | POA: Diagnosis not present

## 2022-07-18 DIAGNOSIS — M62838 Other muscle spasm: Secondary | ICD-10-CM | POA: Diagnosis not present

## 2022-07-18 DIAGNOSIS — R079 Chest pain, unspecified: Secondary | ICD-10-CM | POA: Diagnosis not present

## 2022-07-18 DIAGNOSIS — R9431 Abnormal electrocardiogram [ECG] [EKG]: Secondary | ICD-10-CM | POA: Diagnosis not present

## 2022-07-18 DIAGNOSIS — M549 Dorsalgia, unspecified: Secondary | ICD-10-CM | POA: Diagnosis not present

## 2022-07-18 DIAGNOSIS — Z881 Allergy status to other antibiotic agents status: Secondary | ICD-10-CM | POA: Diagnosis not present

## 2022-07-18 DIAGNOSIS — M5489 Other dorsalgia: Secondary | ICD-10-CM | POA: Diagnosis not present

## 2022-07-18 DIAGNOSIS — I959 Hypotension, unspecified: Secondary | ICD-10-CM | POA: Diagnosis not present

## 2022-09-09 DIAGNOSIS — F320 Major depressive disorder, single episode, mild: Secondary | ICD-10-CM | POA: Diagnosis not present

## 2022-09-09 DIAGNOSIS — F411 Generalized anxiety disorder: Secondary | ICD-10-CM | POA: Diagnosis not present

## 2022-10-06 DIAGNOSIS — F320 Major depressive disorder, single episode, mild: Secondary | ICD-10-CM | POA: Diagnosis not present

## 2022-10-06 DIAGNOSIS — F411 Generalized anxiety disorder: Secondary | ICD-10-CM | POA: Diagnosis not present

## 2022-10-17 ENCOUNTER — Other Ambulatory Visit: Payer: BC Managed Care – PPO

## 2022-10-31 DIAGNOSIS — R635 Abnormal weight gain: Secondary | ICD-10-CM | POA: Diagnosis not present

## 2022-10-31 DIAGNOSIS — Z6841 Body Mass Index (BMI) 40.0 and over, adult: Secondary | ICD-10-CM | POA: Diagnosis not present

## 2022-10-31 DIAGNOSIS — Z130 Encounter for screening for diseases of the blood and blood-forming organs and certain disorders involving the immune mechanism: Secondary | ICD-10-CM | POA: Diagnosis not present

## 2022-11-12 DIAGNOSIS — J069 Acute upper respiratory infection, unspecified: Secondary | ICD-10-CM | POA: Diagnosis not present

## 2022-11-12 DIAGNOSIS — H1089 Other conjunctivitis: Secondary | ICD-10-CM | POA: Diagnosis not present

## 2022-11-24 ENCOUNTER — Ambulatory Visit
Admission: RE | Admit: 2022-11-24 | Discharge: 2022-11-24 | Disposition: A | Discharge status: Home / Self Care | Payer: BC Managed Care – PPO | Source: Ambulatory Visit | Attending: Obstetrics and Gynecology | Admitting: Obstetrics and Gynecology

## 2022-11-24 DIAGNOSIS — N6312 Unspecified lump in the right breast, upper inner quadrant: Secondary | ICD-10-CM | POA: Diagnosis not present

## 2022-11-24 DIAGNOSIS — N6310 Unspecified lump in the right breast, unspecified quadrant: Secondary | ICD-10-CM

## 2022-11-24 DIAGNOSIS — N6311 Unspecified lump in the right breast, upper outer quadrant: Secondary | ICD-10-CM | POA: Diagnosis not present

## 2022-11-25 ENCOUNTER — Other Ambulatory Visit: Payer: Self-pay | Admitting: Obstetrics and Gynecology

## 2022-11-25 DIAGNOSIS — N6310 Unspecified lump in the right breast, unspecified quadrant: Secondary | ICD-10-CM

## 2022-11-25 DIAGNOSIS — F411 Generalized anxiety disorder: Secondary | ICD-10-CM | POA: Diagnosis not present

## 2022-11-25 DIAGNOSIS — F320 Major depressive disorder, single episode, mild: Secondary | ICD-10-CM | POA: Diagnosis not present

## 2022-12-15 ENCOUNTER — Encounter: Payer: Self-pay | Admitting: Physician Assistant

## 2022-12-22 DIAGNOSIS — Z20818 Contact with and (suspected) exposure to other bacterial communicable diseases: Secondary | ICD-10-CM | POA: Diagnosis not present

## 2022-12-22 DIAGNOSIS — J101 Influenza due to other identified influenza virus with other respiratory manifestations: Secondary | ICD-10-CM | POA: Diagnosis not present

## 2022-12-22 DIAGNOSIS — J020 Streptococcal pharyngitis: Secondary | ICD-10-CM | POA: Diagnosis not present

## 2022-12-22 DIAGNOSIS — G4452 New daily persistent headache (NDPH): Secondary | ICD-10-CM | POA: Diagnosis not present

## 2023-01-21 ENCOUNTER — Ambulatory Visit: Payer: BC Managed Care – PPO | Admitting: Physician Assistant

## 2023-01-21 ENCOUNTER — Encounter: Payer: Self-pay | Admitting: Physician Assistant

## 2023-01-21 VITALS — BP 104/72 | HR 72 | Ht 65.0 in | Wt 270.1 lb

## 2023-01-21 DIAGNOSIS — Z800 Family history of malignant neoplasm of digestive organs: Secondary | ICD-10-CM | POA: Diagnosis not present

## 2023-01-21 DIAGNOSIS — R194 Change in bowel habit: Secondary | ICD-10-CM

## 2023-01-21 DIAGNOSIS — F320 Major depressive disorder, single episode, mild: Secondary | ICD-10-CM | POA: Diagnosis not present

## 2023-01-21 DIAGNOSIS — K625 Hemorrhage of anus and rectum: Secondary | ICD-10-CM

## 2023-01-21 DIAGNOSIS — F411 Generalized anxiety disorder: Secondary | ICD-10-CM | POA: Diagnosis not present

## 2023-01-21 MED ORDER — NA SULFATE-K SULFATE-MG SULF 17.5-3.13-1.6 GM/177ML PO SOLN: 1.0000 | Freq: Once | ORAL | 0 refills | Status: AC

## 2023-01-21 NOTE — Progress Notes (Signed)
Chief Complaint: Rectal bleeding, Constipation and Diarrhea  HPI:    Sandra Saunders is a 32 year old Caucasian female with past medical history as listed below including anxiety and depression, who was referred to me by Nicholes Rough, PA-C for a complaint of rectal bleeding, constipation and diarrhea.      10/28/2022 patient saw PCP and at that time discussed still struggling with a hemorrhoid that bled intermittently.  Described a history of rectal cancer in her mother.  Patient was referred to our clinic.    Today, the patient presents to clinic and tells me that ever since she got her gallbladder out she has had trouble with her bowel habits.  She is normally constipated but if she eats the wrong thing she will have diarrhea.  This has been happening since 2017.  Tells me that when she is constipated and she strains then she has burning and bleeding of her rectum.  This will occur about every 2 weeks.  Her PCP gave her a cream which she cannot remember the name of and she is also using Preparation H which tends to help a little bit.  Currently is not having any issues.  She is worried because her mother was diagnosed with rectal cancer at the age of 36 after similar symptoms.    Denies fever, chills, weight loss, or abdominal pain.  Past Medical History:  Diagnosis Date   Anxiety    Depression    Gallstones    H/O varicella    HLD (hyperlipidemia)    PONV (postoperative nausea and vomiting)     Past Surgical History:  Procedure Laterality Date   ANTERIOR CRUCIATE LIGAMENT REPAIR Bilateral    left 2006, right 2012   CHOLECYSTECTOMY N/A 07/30/2016   Procedure: LAPAROSCOPIC CHOLECYSTECTOMY WITH INTRAOPERATIVE CHOLANGIOGRAM;  Surgeon: Greer Pickerel, MD;  Location: [facility];  Service: General;  Laterality: N/A;   WISDOM TOOTH EXTRACTION      Current Outpatient Medications  Medication Sig Dispense Refill   Azelaic Acid (FINACEA) 15 % gel Apply 1 Application topically 2 (two) times daily.      busPIRone (BUSPAR) 7.5 MG tablet Take 7.5 mg by mouth 2 (two) times daily.     FLUoxetine (PROZAC) 10 MG capsule Take 1 capsule by mouth daily.     levonorgestrel (MIRENA) 20 MCG/24HR IUD 1 each by Intrauterine route once.     Phentermine-Topiramate (QSYMIA) 3.75-23 MG CP24 Take 1 capsule by mouth daily. (Patient not taking: Reported on 01/21/2023)     No current facility-administered medications for this visit.    Allergies as of 01/21/2023 - Review Complete 01/21/2023  Allergen Reaction Noted   Doxycycline Nausea Only 08/19/2021   Keflex [cephalexin] Rash 06/27/2011    Family History  Problem Relation Age of Onset   Melanoma Mother    Other Mother        rectal tumor   Gallstones Sister    Heart disease Maternal Grandmother    Lung disease Maternal Grandfather    Colon cancer Paternal Grandmother    Heart disease Paternal Grandmother    Diabetes Paternal Grandfather    Heart disease Paternal Grandfather    Gallstones Half-Sister    Birth defects Niece        omphalocele, half sister's child   Seizures Daughter    ADD / ADHD Daughter    Short stature Daughter    Eczema Son     Social History   Socioeconomic History   Marital status: Married  Spouse name: Not on file   Number of children: 2   Years of education: Not on file   Highest education level: Not on file  Occupational History   Occupation: Naval architect  Tobacco Use   Smoking status: Never   Smokeless tobacco: Never  Vaping Use   Vaping Use: Former   Devices: for 1 month  Substance and Sexual Activity   Alcohol use: No   Drug use: No   Sexual activity: Yes  Other Topics Concern   Not on file  Social History Narrative   Not on file   Social Determinants of Health   Financial Resource Strain: Not on file  Food Insecurity: Not on file  Transportation Needs: Not on file  Physical Activity: Not on file  Stress: Not on file  Social Connections: Not on file  Intimate Partner Violence: Not  on file    Review of Systems:    Constitutional: No weight loss, fever or chills Skin: No rash Cardiovascular: No chest pain Respiratory: No SOB  Gastrointestinal: See HPI and otherwise negative Genitourinary: No dysuria  Neurological: No headache, dizziness or syncope Musculoskeletal: No new muscle or joint pain Hematologic: No bruising Psychiatric: +anxiety   Physical Exam:  Vital signs: BP 104/72 (BP Location: Left Arm, Patient Position: Sitting, Cuff Size: Large)   Pulse 72   Ht 5\' 5"  (1.651 m) Comment: height measured without shoes  Wt 270 lb 2 oz (122.5 kg)   Breastfeeding No   BMI 44.95 kg/m    Constitutional:   Pleasant obese Caucasian female appears to be in NAD, Well developed, Well nourished, alert and cooperative Head:  Normocephalic and atraumatic. Eyes:   PEERL, EOMI. No icterus. Conjunctiva pink. Ears:  Normal auditory acuity. Neck:  Supple Throat: Oral cavity and pharynx without inflammation, swelling or lesion.  Respiratory: Respirations even and unlabored. Lungs clear to auscultation bilaterally.   No wheezes, crackles, or rhonchi.  Cardiovascular: Normal S1, S2. No MRG. Regular rate and rhythm. No peripheral edema, cyanosis or pallor.  Gastrointestinal:  Soft, nondistended, nontender. No rebound or guarding. Normal bowel sounds. No appreciable masses or hepatomegaly. Rectal:  Declined Msk:  Symmetrical without gross deformities. Without edema, no deformity or joint abnormality.  Neurologic:  Alert and  oriented x4;  grossly normal neurologically.  Skin:   Dry and intact without significant lesions or rashes. Psychiatric: Demonstrates good judgement and reason without abnormal affect or behaviors.  RELEVANT LABS AND IMAGING: CBC    Component Value Date/Time   WBC 8.1 07/30/2016 0530   RBC 4.53 07/30/2016 0530   HGB 12.1 07/30/2016 0530   HCT 38.8 07/30/2016 0530   PLT 165 07/30/2016 0530   MCV 85.7 07/30/2016 0530   MCH 26.7 07/30/2016 0530   MCHC  31.2 07/30/2016 0530   RDW 14.0 07/30/2016 0530    CMP     Component Value Date/Time   NA 140 07/30/2016 0530   K 4.2 07/30/2016 0530   CL 105 07/30/2016 0530   CO2 27 07/30/2016 0530   GLUCOSE 100 (H) 07/30/2016 0530   BUN 11 07/30/2016 0530   CREATININE 0.83 07/30/2016 0530   CALCIUM 9.2 07/30/2016 0530   PROT 6.3 (L) 07/30/2016 0530   ALBUMIN 3.4 (L) 07/30/2016 0530   AST 14 (L) 07/30/2016 0530   ALT 10 (L) 07/30/2016 0530   ALKPHOS 82 07/30/2016 0530   BILITOT 1.1 07/30/2016 0530   GFRNONAA >60 07/30/2016 0530   GFRAA >60 07/30/2016 0530    Assessment: 1.  Rectal bleeding: Once every 2 weeks typically after straining for a bowel movement, PCP treated for hemorrhoid which helps sometimes, patient worried given family history of rectal cancer; most likely hemorrhoid or fissure versus less likely polyp or cancer 2.  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  Family history of rectal cancer: In her mother diagnosed at the age of 25  Plan: 1.  Patient is anxious in regards to her family history and would like to go ahead with a colonoscopy.  She declined rectal exam today. Patient was scheduled Dr. Bryan Lemma for diagnostic colonoscopy next Tuesday as she requested a sooner appointment.  Did provide the patient a detailed list of risks for the procedure and she agrees to proceed. Patient is appropriate for endoscopic procedure(s) in the ambulatory ([facility]) setting.  2.  Recommend the patient start a fiber supplement.  We discussed fiber Gummies, Metamucil Citrucel or Benefiber or fiber 1 bar. 3.  Patient to follow in clinic per recommendations after time of colonoscopy.  Sandra Newer, PA-C [facility] 01/21/2023, 1:52 PM  Cc: Nicholes Rough, PA-C

## 2023-01-21 NOTE — Patient Instructions (Signed)
You have been scheduled for a colonoscopy. Please follow written instructions given to you at your visit today.  Please pick up your prep supplies at the pharmacy within the next 1-3 days. If you use inhalers (even only as needed), please bring them with you on the day of your procedure.  _______________________________________________________  If your blood pressure at your visit was 140/90 or greater, please contact your primary care physician to follow up on this.  _______________________________________________________  If you are age 2 or older, your body mass index should be between 23-30. Your Body mass index is 44.95 kg/m. If this is out of the aforementioned range listed, please consider follow up with your Primary Care Provider.  If you are age 65 or younger, your body mass index should be between 19-25. Your Body mass index is 44.95 kg/m. If this is out of the aformentioned range listed, please consider follow up with your Primary Care Provider.   ________________________________________________________  The [facility] providers would like to encourage you to use [facility] to communicate with providers for non-urgent requests or questions.  Due to long hold times on the telephone, sending your provider a message by [facility] may be a faster and more efficient way to get a response.  Please allow 48 business hours for a response.  Please remember that this is for non-urgent requests.  _______________________________________________________

## 2023-01-26 NOTE — Progress Notes (Signed)
Agree with the assessment and plan as outlined by Jennifer Lemmon, PA-C. ? ?Kanetra Ho, DO, FACG ? ?

## 2023-01-27 ENCOUNTER — Encounter: Payer: Self-pay | Admitting: Gastroenterology

## 2023-01-27 ENCOUNTER — Ambulatory Visit (AMBULATORY_SURGERY_CENTER): Payer: BC Managed Care – PPO | Admitting: Gastroenterology

## 2023-01-27 VITALS — BP 107/65 | HR 68 | Temp 98.4°F | Resp 11 | Ht 65.0 in | Wt 270.0 lb

## 2023-01-27 DIAGNOSIS — K921 Melena: Secondary | ICD-10-CM

## 2023-01-27 DIAGNOSIS — R197 Diarrhea, unspecified: Secondary | ICD-10-CM | POA: Diagnosis not present

## 2023-01-27 DIAGNOSIS — K5730 Diverticulosis of large intestine without perforation or abscess without bleeding: Secondary | ICD-10-CM

## 2023-01-27 DIAGNOSIS — R194 Change in bowel habit: Secondary | ICD-10-CM

## 2023-01-27 DIAGNOSIS — K641 Second degree hemorrhoids: Secondary | ICD-10-CM

## 2023-01-27 DIAGNOSIS — K625 Hemorrhage of anus and rectum: Secondary | ICD-10-CM | POA: Diagnosis not present

## 2023-01-27 MED ORDER — SODIUM CHLORIDE 0.9 % IV SOLN: 500.0000 mL | Freq: Once | INTRAVENOUS | Status: DC

## 2023-01-27 NOTE — Patient Instructions (Addendum)
  Await pathology results on colon biopsies done  Continue present medications and diet   Handout on hemorrhoids & diverticulosis given to you today Handout on hemorrhoid Banding given to you today  Use fiber such as Citrucel,Metamucil,FiberCon, or Konsyl     YOU HAD AN ENDOSCOPIC PROCEDURE TODAY AT THE [facility]:   Refer to the procedure report that was given to you for any specific questions about what was found during the examination.  If the procedure report does not answer your questions, please call your gastroenterologist to clarify.  If you requested that your care partner not be given the details of your procedure findings, then the procedure report has been included in a sealed envelope for you to review at your convenience later.  YOU SHOULD EXPECT: Some feelings of bloating in the abdomen. Passage of more gas than usual.  Walking can help get rid of the air that was put into your GI tract during the procedure and reduce the bloating. If you had a lower endoscopy (such as a colonoscopy or flexible sigmoidoscopy) you may notice spotting of blood in your stool or on the toilet paper. If you underwent a bowel prep for your procedure, you may not have a normal bowel movement for a few days.  Please Note:  You might notice some irritation and congestion in your nose or some drainage.  This is from the oxygen used during your procedure.  There is no need for concern and it should clear up in a day or so.  SYMPTOMS TO REPORT IMMEDIATELY:  Following lower endoscopy (colonoscopy or flexible sigmoidoscopy):  Excessive amounts of blood in the stool  Significant tenderness or worsening of abdominal pains  Swelling of the abdomen that is new, acute  Fever of 100F or higher   For urgent or emergent issues, a gastroenterologist can be reached at any hour by calling [phone]. Do not use MyChart messaging for urgent concerns.    DIET:  We do recommend a small meal at  first, but then you may proceed to your regular diet.  Drink plenty of fluids but you should avoid alcoholic beverages for 24 hours.  ACTIVITY:  You should plan to take it easy for the rest of today and you should NOT DRIVE or use heavy machinery until tomorrow (because of the sedation medicines used during the test).    FOLLOW UP: Our staff will call the number listed on your records the next business day following your procedure.  We will call around 7:15- 8:00 am to check on you and address any questions or concerns that you may have regarding the information given to you following your procedure. If we do not reach you, we will leave a message.     If any biopsies were taken you will be contacted by phone or by letter within the next 1-3 weeks.  Please call us at [phone] if you have not heard about the biopsies in 3 weeks.    SIGNATURES/CONFIDENTIALITY: You and/or your care partner have signed paperwork which will be entered into your electronic medical record.  These signatures attest to the fact that that the information above on your After Visit Summary has been reviewed and is understood.  Full responsibility of the confidentiality of this discharge information lies with you and/or your care-partner.

## 2023-01-27 NOTE — Progress Notes (Signed)
GASTROENTEROLOGY PROCEDURE H&P NOTE   Primary Care Physician: [facility]    Reason for Procedure:  Hematochezia, change in bowel habits, constipation, diarrhea, family history of colon cancer  Plan:    Colonoscopy  Patient is appropriate for endoscopic procedure(s) in the ambulatory ([facility]) setting.  The nature of the procedure, as well as the risks, benefits, and alternatives were carefully and thoroughly reviewed with the patient. Ample time for discussion and questions allowed. The patient understood, was satisfied, and agreed to proceed.     HPI: Sandra Saunders is a 32 y.o. female who presents for colonoscopy for evaluation of hematochezia, change in bowel habits (diarrhea with episodic constipation), and family history of colorectal cancer (mother diagnosed with rectal cancer at age 26).  Patient was most recently seen in the Gastroenterology Clinic on 01/21/2023 by Hyacinth Meeker, PA-C.  No interval change in medical history since that appointment. Please refer to that note for full details regarding GI history and clinical presentation.   Past Medical History:  Diagnosis Date   Anxiety    Depression    Gallstones    H/O varicella    HLD (hyperlipidemia)    PONV (postoperative nausea and vomiting)     Past Surgical History:  Procedure Laterality Date   ANTERIOR CRUCIATE LIGAMENT REPAIR Bilateral    left 2006, right 2012   CHOLECYSTECTOMY N/A 07/30/2016   Procedure: LAPAROSCOPIC CHOLECYSTECTOMY WITH INTRAOPERATIVE CHOLANGIOGRAM;  Surgeon: Gaynelle Adu, MD;  Location: [facility] OR;  Service: General;  Laterality: N/A;   WISDOM TOOTH EXTRACTION      Prior to Admission medications   Medication Sig Start Date End Date Taking? Authorizing Provider  busPIRone (BUSPAR) 7.5 MG tablet Take 7.5 mg by mouth 2 (two) times daily.   Yes [provider]  FLUoxetine (PROZAC) 10 MG capsule Take 1 capsule by mouth daily. 11/29/21  Yes [provider]   levonorgestrel (MIRENA) 20 MCG/24HR IUD 1 each by Intrauterine route once.   Yes [provider]  Azelaic Acid (FINACEA) 15 % gel Apply 1 Application topically 2 (two) times daily. 04/06/15   [provider]  Phentermine-Topiramate (QSYMIA) 3.75-23 MG CP24 Take 1 capsule by mouth daily. Patient not taking: Reported on 01/21/2023 11/04/22   [provider]    Current Outpatient Medications  Medication Sig Dispense Refill   busPIRone (BUSPAR) 7.5 MG tablet Take 7.5 mg by mouth 2 (two) times daily.     FLUoxetine (PROZAC) 10 MG capsule Take 1 capsule by mouth daily.     levonorgestrel (MIRENA) 20 MCG/24HR IUD 1 each by Intrauterine route once.     Azelaic Acid (FINACEA) 15 % gel Apply 1 Application topically 2 (two) times daily.     Phentermine-Topiramate (QSYMIA) 3.75-23 MG CP24 Take 1 capsule by mouth daily. (Patient not taking: Reported on 01/21/2023)     Current Facility-Administered Medications  Medication Dose Route Frequency Provider Last Rate Last Admin   0.9 %  sodium chloride infusion  500 mL Intravenous Once Romey Mathieson V, DO        Allergies as of 01/27/2023 - Review Complete 01/27/2023  Allergen Reaction Noted   Doxycycline Nausea Only 08/19/2021   Keflex [cephalexin] Rash 06/27/2011    Family History  Problem Relation Age of Onset   Colon cancer Mother    Melanoma Mother    Other Mother        rectal tumor   Gallstones Sister    Heart disease Maternal Grandmother    Lung  disease Maternal Grandfather    Colon cancer Paternal Grandmother    Heart disease Paternal Grandmother    Diabetes Paternal Grandfather    Heart disease Paternal Grandfather    Seizures Daughter    ADD / ADHD Daughter    Short stature Daughter    Eczema Son    Gallstones Half-Sister    Birth defects Niece        omphalocele, half sister's child    Social History   Socioeconomic History   Marital status: Married    Spouse name: Not on file   Number of  children: 2   Years of education: Not on file   Highest education level: Not on file  Occupational History   Occupation: Administrator, arts  Tobacco Use   Smoking status: Never   Smokeless tobacco: Never  Vaping Use   Vaping Use: Former   Devices: for 1 month  Substance and Sexual Activity   Alcohol use: No   Drug use: No   Sexual activity: Yes  Other Topics Concern   Not on file  Social History Narrative   Not on file   Social Determinants of Health   Financial Resource Strain: Not on file  Food Insecurity: Not on file  Transportation Needs: Not on file  Physical Activity: Not on file  Stress: Not on file  Social Connections: Not on file  Intimate Partner Violence: Not on file    Physical Exam: Vital signs in last 24 hours: @BP  108/67   Pulse 71   Temp 98.4 F (36.9 C) (Skin)   Ht 5\' 5"  (1.651 m)   Wt 270 lb (122.5 kg)   SpO2 100%   BMI 44.93 kg/m  GEN: NAD EYE: Sclerae anicteric ENT: MMM CV: Non-tachycardic Pulm: CTA b/l GI: Soft, NT/ND NEURO:  Alert & Oriented x 3   Doristine Locks, DO [facility]   01/27/2023 9:18 AM

## 2023-01-27 NOTE — Op Note (Signed)
[facility] Patient Name: Sandra CoderKendra Taliercio Procedure Date: 01/27/2023 9:17 AM MRN: [id number] Endoscopist: Doristine LocksVito Ladarion Munyon , MD, [id number] Age: [age] Referring MD:  Date of Birth: 1991-05-31 Gender: Female Account #: [id number] Procedure:                Colonoscopy Indications:              Hematochezia, Change in bowel habits, Diarrhea with                            episodic constipation                           Family history notable for mother with rectal                            cancer at age 33. Medicines:                Monitored Anesthesia Care Procedure:                Pre-Anesthesia Assessment:                           - Prior to the procedure, a History and Physical                            was performed, and patient medications and                            allergies were reviewed. The patient's tolerance of                            previous anesthesia was also reviewed. The risks                            and benefits of the procedure and the sedation                            options and risks were discussed with the patient.                            All questions were answered, and informed consent                            was obtained. Prior Anticoagulants: The patient has                            taken no anticoagulant or antiplatelet agents. ASA                            Grade Assessment: II - A patient with mild systemic                            disease. After reviewing the risks and benefits,  the patient was deemed in satisfactory condition to                            undergo the procedure.                           After obtaining informed consent, the colonoscope                            was passed under direct vision. Throughout the                            procedure, the patient's blood pressure, pulse, and                            oxygen saturations were monitored continuously. The                             CF [id number] #[id number] was introduced through the anus                            and advanced to the 10 cm into the ileum. The                            colonoscopy was performed without difficulty. The                            patient tolerated the procedure well. The quality                            of the bowel preparation was excellent. The                            terminal ileum, ileocecal valve, appendiceal                            orifice, and rectum were photographed. Scope In: 9:28:43 AM Scope Out: 9:43:04 AM Scope Withdrawal Time: 0 hours 11 minutes 59 seconds  Total Procedure Duration: 0 hours 14 minutes 21 seconds  Findings:                 Hemorrhoids were found on perianal exam.                           A few small-mouthed diverticula were found in the                            sigmoid colon.                           Normal mucosa was found in the entire colon.                            Biopsies for histology were taken with a cold  forceps from the right colon and left colon for                            evaluation of microscopic colitis. Estimated blood                            loss was minimal.                           Non-bleeding internal hemorrhoids were found during                            retroflexion. The hemorrhoids were small and Grade                            II (internal hemorrhoids that prolapse but reduce                            spontaneously).                           The terminal ileum appeared normal. Complications:            No immediate complications. Estimated Blood Loss:     Estimated blood loss was minimal. Impression:               - Hemorrhoids found on perianal exam.                           - Diverticulosis in the sigmoid colon.                           - Normal mucosa in the entire examined colon.                            Biopsied.                           - Non-bleeding internal  hemorrhoids.                           - The examined portion of the ileum was normal.                           - The GI Genius (intelligent endoscopy module),                            computer-aided polyp detection system powered by AI                            was utilized to detect colorectal polyps through                            enhanced visualization during colonoscopy. Recommendation:           - Patient has a contact number available for  emergencies. The signs and symptoms of potential                            delayed complications were discussed with the                            patient. Return to normal activities tomorrow.                            Written discharge instructions were provided to the                            patient.                           - Resume previous diet.                           - Continue present medications.                           - Await pathology results.                           - Repeat colonoscopy in 8 years (age 63) for                            screening purposes due to family history.                           - Use fiber, for example Citrucel, Fibercon, Konsyl                            or Metamucil.                           - Return to GI clinic PRN.                           - Internal hemorrhoids were noted on this study and                            may be amenable to hemorrhoid band ligation. If you                            are interested in further treatment of these                            hemorrhoids with band ligation, please contact my                            clinic to set up an appointment for evaluation and                            treatment. Doristine Locks, MD 01/27/2023 9:49:27 AM

## 2023-01-27 NOTE — Progress Notes (Signed)
Called to room to assist during endoscopic procedure.  Patient ID and intended procedure confirmed with present staff. Received instructions for my participation in the procedure from the performing physician.  

## 2023-01-27 NOTE — Progress Notes (Signed)
Report to PACU, RN, vss, BBS= Clear.  

## 2023-01-27 NOTE — Progress Notes (Signed)
Pt's states no medical or surgical changes since previsit or office visit. 

## 2023-01-28 ENCOUNTER — Telehealth: Payer: Self-pay

## 2023-01-28 ENCOUNTER — Telehealth: Payer: Self-pay | Admitting: *Deleted

## 2023-01-28 NOTE — Telephone Encounter (Signed)
Called and LVM to patient's cell phone to return the call to inform her that Dr. Barron Alvine has recommended Hemorrhoid Banding. Patient is scheduled on 03/27/23 at 3:30. Message sent Via MyChart as well

## 2023-01-28 NOTE — Telephone Encounter (Signed)
Post procedure follow up call placed, no answer and left VM.  

## 2023-01-28 NOTE — Telephone Encounter (Signed)
-----   Message from [facility] V, DO sent at 01/27/2023 12:08 PM EDT ----- Can you please set this patient up for hemorrhoid banding appt. Thanks!

## 2023-03-27 ENCOUNTER — Ambulatory Visit: Payer: BC Managed Care – PPO | Admitting: Gastroenterology

## 2023-03-27 ENCOUNTER — Encounter: Payer: Self-pay | Admitting: Gastroenterology

## 2023-03-27 VITALS — BP 122/88 | HR 106 | Ht 65.0 in | Wt 266.0 lb

## 2023-03-27 DIAGNOSIS — K641 Second degree hemorrhoids: Secondary | ICD-10-CM

## 2023-03-27 NOTE — Progress Notes (Signed)
Chief Complaint:    Symptomatic Internal Hemorrhoids; Hemorrhoid Band Ligation  GI History: 32 year old female with family history notable for mother with rectal cancer, follows in the GI clinic for symptomatic hemorrhoids.  Has intermittent BRBPR, which is been incompletely responsive to topical therapy.  - 01/27/2023: Colonoscopy: Sigmoid diverticulosis, otherwise normal colon (path negative for microscopic colitis).  Grade 2 internal hemorrhoids.  Normal TI.  Repeat colonoscopy at age 19   HPI:     Patient is a 32 y.o. femalewith a history of symptomatic internal hemorrhoids presenting to the Gastroenterology Clinic for follow-up and ongoing treatment. The patient presents with symptomatic grade 2 hemorrhoids, unresponsive to maximal medical therapy, requesting rubber band ligation of symptomatic hemorrhoidal disease.  No change in medical or surgical history, medications, allergies, social history since last appointment in the GI clinic.   Review of systems:     No chest pain, no SOB, no fevers, no urinary sx   Past Medical History:  Diagnosis Date   Anxiety    Depression    Gallstones    H/O varicella    HLD (hyperlipidemia)    PONV (postoperative nausea and vomiting)     Patient's surgical history, family medical history, social history, medications and allergies were all reviewed in Epic    Current Outpatient Medications  Medication Sig Dispense Refill   Azelaic Acid (FINACEA) 15 % gel Apply 1 Application topically 2 (two) times daily.     busPIRone (BUSPAR) 7.5 MG tablet Take 7.5 mg by mouth 2 (two) times daily.     FLUoxetine (PROZAC) 10 MG capsule Take 1 capsule by mouth daily.     levonorgestrel (MIRENA) 20 MCG/24HR IUD 1 each by Intrauterine route once.     Phentermine-Topiramate (QSYMIA) 3.75-23 MG CP24 Take 1 capsule by mouth daily. (Patient not taking: Reported on 03/27/2023)     No current facility-administered medications for this visit.    Physical Exam:      BP (!) 160/90   Pulse (!) 106   Ht 5\' 5"  (1.651 m)   Wt 266 lb (120.7 kg)   BMI 44.26 kg/m   GENERAL:  Pleasant female in NAD PSYCH: : Cooperative, normal affect NEURO: Alert and oriented x 3, no focal neurologic deficits Rectal exam: Sensation intact and preserved anal wink.  Small external skin tag.  Grade 2 hemorrhoids noted in all positions on exam.  No external anal fissures noted. Normal sphincter tone. No palpable mass. No blood on the exam glove. (Chaperone: Ailene Rud, CMA).   IMPRESSION and PLAN:    #1.  Symptomatic internal hemorrhoids: PROCEDURE NOTE: The patient presents with symptomatic grade 2 hemorrhoids, unresponsive to maximal medical therapy, requesting rubber band ligation of symptomatic hemorrhoidal disease.  All risks, benefits and alternative forms of therapy were described and informed consent was obtained.  In the Left Lateral Decubitus position, anoscopic examination revealed grade 2 hemorrhoids in the all position(s).  The anorectum was pre-medicated with RectiCare. The decision was made to band the LL internal hemorrhoid, and the [facility] O'Regan System was used to perform band ligation without complication.  Digital anorectal examination was then performed to assure proper positioning of the band, and to adjust the banded tissue as required.  The patient was discharged home without pain or other issues.  Dietary and behavioral recommendations were given and along with follow-up instructions.     The following adjunctive treatments were recommended:  -Resume high-fiber diet with fiber supplement (i.e. Citrucel or Benefiber) with goal for soft  stools without straining to have a BM. -Resume adequate fluid intake.  The patient will return in 4 for follow-up and possible additional banding as required. No complications were encountered and the patient tolerated the procedure well.      #2.  Family history of colorectal cancer - Repeat colonoscopy at age  32      Shellia Cleverly ,DO, [facility] 03/27/2023, 3:38 PM

## 2023-03-27 NOTE — Patient Instructions (Addendum)
You have been scheduled for an appointment with Dr. Barron Alvine on 06/01/23 at 3:40 PM . Please arrive 10 minutes early for your appointment.   HEMORRHOID BANDING PROCEDURE    FOLLOW-UP CARE   The procedure you have had should have been relatively painless since the banding of the area involved does not have nerve endings and there is no pain sensation.  The rubber band cuts off the blood supply to the hemorrhoid and the band may fall off as soon as 48 hours after the banding (the band may occasionally be seen in the toilet bowl following a bowel movement). You may notice a temporary feeling of fullness in the rectum which should respond adequately to plain Tylenol or Motrin.  Following the banding, avoid strenuous exercise that evening and resume full activity the next day.  A sitz bath (soaking in a warm tub) or bidet is soothing, and can be useful for cleansing the area after bowel movements.     To avoid constipation, take two tablespoons of natural wheat bran, natural oat bran, flax, Benefiber or any over the counter fiber supplement and increase your water intake to 7-8 glasses daily.    Unless you have been prescribed anorectal medication, do not put anything inside your rectum for two weeks: No suppositories, enemas, fingers, etc.  Occasionally, you may have more bleeding than usual after the banding procedure.  This is often from the untreated hemorrhoids rather than the treated one.  Don't be concerned if there is a tablespoon or so of blood.  If there is more blood than this, lie flat with your bottom higher than your head and apply an ice pack to the area. If the bleeding does not stop within a half an hour or if you feel faint, call our office at [phone] or go to the emergency room.  Problems are not common; however, if there is a substantial amount of bleeding, severe pain, chills, fever or difficulty passing urine (very rare) or other problems, you should call us at [phone] or report to the nearest emergency room.  Do not stay seated continuously for more than 2-3 hours for a day or two after the procedure.  Tighten your buttock muscles 10-15 times every two hours and take 10-15 deep breaths every 1-2 hours.  Do not spend more than a few minutes on the toilet if you cannot empty your bowel; instead re-visit the toilet at a later time.

## 2023-05-27 ENCOUNTER — Ambulatory Visit: Admission: RE | Admit: 2023-05-27 | Payer: BC Managed Care – PPO | Source: Ambulatory Visit

## 2023-05-27 DIAGNOSIS — N6310 Unspecified lump in the right breast, unspecified quadrant: Secondary | ICD-10-CM

## 2023-06-01 ENCOUNTER — Encounter: Payer: BC Managed Care – PPO | Admitting: Gastroenterology

## 2023-07-16 ENCOUNTER — Ambulatory Visit: Payer: BC Managed Care – PPO | Admitting: Gastroenterology

## 2023-07-16 ENCOUNTER — Encounter: Payer: Self-pay | Admitting: Gastroenterology

## 2023-07-16 VITALS — BP 126/80 | HR 97 | Ht 65.0 in | Wt 253.0 lb

## 2023-07-16 DIAGNOSIS — K649 Unspecified hemorrhoids: Secondary | ICD-10-CM

## 2023-07-16 NOTE — Patient Instructions (Signed)
HEMORRHOID BANDING PROCEDURE    FOLLOW-UP CARE   The procedure you have had should have been relatively painless since the banding of the area involved does not have nerve endings and there is no pain sensation.  The rubber band cuts off the blood supply to the hemorrhoid and the band may fall off as soon as 48 hours after the banding (the band may occasionally be seen in the toilet bowl following a bowel movement). You may notice a temporary feeling of fullness in the rectum which should respond adequately to plain Tylenol or Motrin.  Following the banding, avoid strenuous exercise that evening and resume full activity the next day.  A sitz bath (soaking in a warm tub) or bidet is soothing, and can be useful for cleansing the area after bowel movements.     To avoid constipation, take two tablespoons of natural wheat bran, natural oat bran, flax, Benefiber or any over the counter fiber supplement and increase your water intake to 7-8 glasses daily.    Unless you have been prescribed anorectal medication, do not put anything inside your rectum for two weeks: No suppositories, enemas, fingers, etc.  Occasionally, you may have more bleeding than usual after the banding procedure.  This is often from the untreated hemorrhoids rather than the treated one.  Don't be concerned if there is a tablespoon or so of blood.  If there is more blood than this, lie flat with your bottom higher than your head and apply an ice pack to the area. If the bleeding does not stop within a half an hour or if you feel faint, call our office at [phone] or go to the emergency room.  Problems are not common; however, if there is a substantial amount of bleeding, severe pain, chills, fever or difficulty passing urine (very rare) or other problems, you should call us at [phone] or report to the nearest emergency room.  Do not stay seated continuously for more than 2-3 hours for a day or two after the procedure.   Tighten your buttock muscles 10-15 times every two hours and take 10-15 deep breaths every 1-2 hours.  Do not spend more than a few minutes on the toilet if you cannot empty your bowel; instead re-visit the toilet at a later time.    Thank you for entrusting me with your care and choosing [facility].  Dr Anson Fret

## 2023-07-16 NOTE — Progress Notes (Signed)
Chief Complaint:    Symptomatic Internal Hemorrhoids; Hemorrhoid Band Ligation  GI History: 32 year old female with family history notable for mother with rectal cancer, follows in the GI clinic for symptomatic hemorrhoids.  Has intermittent BRBPR, which is been incompletely responsive to topical therapy.   - 01/27/2023: Colonoscopy: Sigmoid diverticulosis, otherwise normal colon (path negative for microscopic colitis).  Grade 2 internal hemorrhoids.  Normal TI.  Repeat colonoscopy at age 18 - 03/27/2023: Banding of LL hemorrhoid  HPI:     Patient is a 32 y.o. femalewith a history of symptomatic internal hemorrhoids presenting to the Gastroenterology Clinic for follow-up and ongoing treatment. The patient presents with symptomatic grade 2 hemorrhoids, unresponsive to maximal medical therapy, requesting rubber band ligation of symptomatic hemorrhoidal disease.  Did well with the first hemorrhoid banding.  Still with intermittent symptoms with intermittent BRBPR.  No change in medical or surgical history, medications, allergies, social history since last appointment with me.   Review of systems:     No chest pain, no SOB, no fevers, no urinary sx   Past Medical History:  Diagnosis Date   Anxiety    Depression    Gallstones    H/O varicella    HLD (hyperlipidemia)    PONV (postoperative nausea and vomiting)     Patient's surgical history, family medical history, social history, medications and allergies were all reviewed in Epic    Current Outpatient Medications  Medication Sig Dispense Refill   Azelaic Acid (FINACEA) 15 % gel Apply 1 Application topically 2 (two) times daily.     busPIRone (BUSPAR) 7.5 MG tablet Take 7.5 mg by mouth 2 (two) times daily.     levonorgestrel (MIRENA) 20 MCG/24HR IUD 1 each by Intrauterine route once.     phentermine 15 MG capsule Take 15 mg by mouth every morning.     topiramate (TOPAMAX) 50 MG tablet Take by mouth.     FLUoxetine (PROZAC) 10 MG  capsule Take 1 capsule by mouth daily. (Patient not taking: Reported on 07/16/2023)     Phentermine-Topiramate (QSYMIA) 3.75-23 MG CP24 Take 1 capsule by mouth daily. (Patient not taking: Reported on 07/16/2023)     No current facility-administered medications for this visit.    Physical Exam:     BP 126/80   Pulse 97   Ht 5\' 5"  (1.651 m)   Wt 253 lb (114.8 kg)   BMI 42.10 kg/m   GENERAL:  Pleasant female in NAD PSYCH: : Cooperative, normal affect NEURO: Alert and oriented x 3, no focal neurologic deficits Rectal exam: Sensation intact and preserved anal wink.  Small external skin tags.  Grade 2 hemorrhoids noted in RA and RP positions on exam.  No external anal fissures noted. Normal sphincter tone. No palpable mass. No blood on the exam glove. (Chaperone: Tisha, CMA).   IMPRESSION and PLAN:    #1.  Symptomatic internal hemorrhoids: PROCEDURE NOTE: The patient presents with symptomatic grade 2 hemorrhoids, unresponsive to maximal medical therapy, requesting rubber band ligation of symptomatic hemorrhoidal disease.  All risks, benefits and alternative forms of therapy were described and informed consent was obtained.  In the Left Lateral Decubitus position, anoscopic examination revealed grade 2 hemorrhoids in the RA and RP position(s).  The anorectum was pre-medicated with RectiCare. The decision was made to band the RA internal hemorrhoid, and the [facility] O'Regan System was used to perform band ligation without complication.  Digital anorectal examination was then performed to assure proper positioning of the band, and to  adjust the banded tissue as required.  The patient was discharged home without pain or other issues.  Dietary and behavioral recommendations were given and along with follow-up instructions.     The following adjunctive treatments were recommended:  -Resume high-fiber diet with fiber supplement (i.e. Citrucel or Benefiber) with goal for soft stools without straining  to have a BM. -Resume adequate fluid intake.  The patient will return in 4 for follow-up and possible additional banding as required. No complications were encountered and the patient tolerated the procedure well.      #2.   Family history of colorectal cancer - Repeat colonoscopy at age 76      Shellia Cleverly ,DO, [facility] 07/16/2023, 11:45 AM

## 2023-10-22 ENCOUNTER — Encounter: Payer: BC Managed Care – PPO | Admitting: Gastroenterology

## 2023-10-23 ENCOUNTER — Encounter: Payer: BC Managed Care – PPO | Admitting: Gastroenterology

## 2023-12-31 ENCOUNTER — Ambulatory Visit: Payer: BC Managed Care – PPO | Admitting: Gastroenterology

## 2023-12-31 ENCOUNTER — Encounter: Payer: Self-pay | Admitting: Gastroenterology

## 2023-12-31 VITALS — BP 130/90 | HR 86 | Ht 65.0 in | Wt 243.0 lb

## 2023-12-31 DIAGNOSIS — K641 Second degree hemorrhoids: Secondary | ICD-10-CM

## 2023-12-31 DIAGNOSIS — Z800 Family history of malignant neoplasm of digestive organs: Secondary | ICD-10-CM

## 2023-12-31 NOTE — Progress Notes (Signed)
 Chief Complaint:    Symptomatic Internal Hemorrhoids; Hemorrhoid Band Ligation  GI History: 33 year old female with family history notable for mother with rectal cancer, follows in the GI clinic for symptomatic hemorrhoids.  Has intermittent BRBPR, which is been incompletely responsive to topical therapy.   - 01/27/2023: Colonoscopy: Sigmoid diverticulosis, otherwise normal colon (path negative for microscopic colitis).  Grade 2 internal hemorrhoids.  Normal TI.  Repeat colonoscopy at age 33 - 03/27/2023: Banding of LL hemorrhoid - 07/16/2023: Banding of RA hemorrhoid  HPI:     Patient is a 33 y.o. femalewith a history of symptomatic internal hemorrhoids presenting to the Gastroenterology Clinic for follow-up and ongoing treatment. The patient presents with symptomatic grade 2 hemorrhoids, unresponsive to maximal medical therapy, requesting rubber band ligation of symptomatic hemorrhoidal disease.  Has done well with each of the hemorrhoid banding's so far.  No change in medical or surgical history, medications, allergies, social history since last appointment with me.   Review of systems:     No chest pain, no SOB, no fevers, no urinary sx   Past Medical History:  Diagnosis Date   Anxiety    Depression    Gallstones    H/O varicella    HLD (hyperlipidemia)    PONV (postoperative nausea and vomiting)     Patient's surgical history, family medical history, social history, medications and allergies were all reviewed in Epic    Current Outpatient Medications  Medication Sig Dispense Refill   busPIRone (BUSPAR) 7.5 MG tablet Take 7.5 mg by mouth 2 (two) times daily.     levonorgestrel (MIRENA) 20 MCG/24HR IUD 1 each by Intrauterine route once.     phentermine 15 MG capsule Take 15 mg by mouth every morning.     topiramate (TOPAMAX) 50 MG tablet Take by mouth.     Azelaic Acid (FINACEA) 15 % gel Apply 1 Application topically 2 (two) times daily. (Patient not taking: Reported on  12/31/2023)     Phentermine-Topiramate (QSYMIA) 3.75-23 MG CP24 Take 1 capsule by mouth daily. (Patient not taking: Reported on 12/31/2023)     No current facility-administered medications for this visit.    Physical Exam:     BP (!) 130/90   Pulse 86   Ht 5\' 5"  (1.651 m)   Wt 243 lb (110.2 kg)   BMI 40.44 kg/m   GENERAL:  Pleasant female in NAD PSYCH: : Cooperative, normal affect NEURO: Alert and oriented x 3, no focal neurologic deficits Rectal exam: Sensation intact and preserved anal wink.  Small external skin tags.  Grade 2 hemorrhoid in RP position on exam.  No external anal fissures noted. Normal sphincter tone. No palpable mass. No blood on the exam glove. (Chaperone: Thompson Grayer, CMA).   IMPRESSION and PLAN:    #1.  Symptomatic internal hemorrhoids: PROCEDURE NOTE: The patient presents with symptomatic grade 2 hemorrhoids, unresponsive to maximal medical therapy, requesting rubber band ligation of symptomatic hemorrhoidal disease.  All risks, benefits and alternative forms of therapy were described and informed consent was obtained.  In the Left Lateral Decubitus position, anoscopic examination revealed grade 2 hemorrhoids in the RP position(s).  The anorectum was pre-medicated with RectiCare. The decision was made to band the RP internal hemorrhoid, and the [facility] O'Regan System was used to perform band ligation without complication.  Digital anorectal examination was then performed to assure proper positioning of the band, and to adjust the banded tissue as required.  The patient was discharged home without pain or other  issues.  Dietary and behavioral recommendations were given and along with follow-up instructions.     The following adjunctive treatments were recommended:  -Resume high-fiber diet with fiber supplement (i.e. Citrucel or Benefiber) with goal for soft stools without straining to have a BM. -Resume adequate fluid intake.  The patient will return as needed  for follow-up and possible additional banding if return of hemorrhoidal symptoms. No complications were encountered and the patient tolerated the procedure well.    #2.  Family history of colorectal cancer - Repeat colonoscopy at age 33      Shellia Cleverly ,DO, [facility] 12/31/2023, 3:51 PM

## 2023-12-31 NOTE — Patient Instructions (Addendum)
 _______________________________________________________  If your blood pressure at your visit was 140/90 or greater, please contact your primary care physician to follow up on this.  If you are age 34 or younger, your body mass index should be between 19-25. Your Body mass index is 40.44 kg/m. If this is out of the aformentioned range listed, please consider follow up with your Primary Care Provider.  ________________________________________________________  The [facility] providers would like to encourage you to use [facility] to communicate with providers for non-urgent requests or questions.  Due to long hold times on the telephone, sending your provider a message by [facility] may be a faster and more efficient way to get a response.  Please allow 48 business hours for a response.  Please remember that this is for non-urgent requests.  _______________________________________________________  It was a pleasure to see you today!  Vito Cirigliano, D.O.

## 2024-05-27 ENCOUNTER — Other Ambulatory Visit: Payer: Self-pay | Admitting: Obstetrics and Gynecology

## 2024-05-27 DIAGNOSIS — N6310 Unspecified lump in the right breast, unspecified quadrant: Secondary | ICD-10-CM

## 2024-06-13 ENCOUNTER — Ambulatory Visit
Admission: RE | Admit: 2024-06-13 | Discharge: 2024-06-13 | Disposition: A | Discharge status: Home / Self Care | Source: Ambulatory Visit | Attending: Obstetrics and Gynecology | Admitting: Obstetrics and Gynecology

## 2024-06-13 DIAGNOSIS — N6310 Unspecified lump in the right breast, unspecified quadrant: Secondary | ICD-10-CM
